# Patient Record
Sex: Female | Born: 1960 | Hispanic: No | Marital: Married
Health system: Northeastern US, Academic
[De-identification: ages and names within clinical notes are randomized; demographics above are authoritative.]

## PROBLEM LIST (undated history)

## (undated) DIAGNOSIS — E039 Hypothyroidism, unspecified: Secondary | ICD-10-CM

## (undated) DIAGNOSIS — Z8669 Personal history of other diseases of the nervous system and sense organs: Secondary | ICD-10-CM

## (undated) DIAGNOSIS — G893 Neoplasm related pain (acute) (chronic): Secondary | ICD-10-CM

## (undated) HISTORY — PX: BONE RESECTION, RIB: SHX900

## (undated) HISTORY — PX: ABDOMINAL HYSTERECTOMY: SHX81

---

## 1998-07-30 ENCOUNTER — Other Ambulatory Visit: Admission: RE | Admit: 1998-07-30 | Discharge: 1998-07-30 | Payer: Self-pay | Admitting: Gynecology

## 1998-10-30 ENCOUNTER — Ambulatory Visit (HOSPITAL_BASED_OUTPATIENT_CLINIC_OR_DEPARTMENT_OTHER): Admission: RE | Admit: 1998-10-30 | Discharge: 1998-10-30 | Payer: Self-pay | Admitting: Surgery

## 1999-03-24 ENCOUNTER — Encounter: Admission: RE | Admit: 1999-03-24 | Discharge: 1999-03-24 | Payer: Self-pay | Admitting: Chiropractic Medicine

## 1999-03-24 ENCOUNTER — Encounter: Payer: Self-pay | Admitting: Chiropractic Medicine

## 1999-04-06 ENCOUNTER — Encounter: Payer: Self-pay | Admitting: Orthopedic Surgery

## 1999-04-06 ENCOUNTER — Encounter: Admission: RE | Admit: 1999-04-06 | Discharge: 1999-04-06 | Payer: Self-pay | Admitting: Orthopedic Surgery

## 2000-01-18 ENCOUNTER — Other Ambulatory Visit: Admission: RE | Admit: 2000-01-18 | Discharge: 2000-01-18 | Payer: Self-pay | Admitting: Gynecology

## 2001-01-22 ENCOUNTER — Other Ambulatory Visit: Admission: RE | Admit: 2001-01-22 | Discharge: 2001-01-22 | Payer: Self-pay | Admitting: Gynecology

## 2002-05-28 ENCOUNTER — Encounter: Payer: Self-pay | Admitting: Endocrinology

## 2002-05-28 ENCOUNTER — Ambulatory Visit (HOSPITAL_COMMUNITY): Admission: RE | Admit: 2002-05-28 | Discharge: 2002-05-28 | Payer: Self-pay | Admitting: Endocrinology

## 2009-04-03 ENCOUNTER — Emergency Department (HOSPITAL_COMMUNITY): Admission: EM | Admit: 2009-04-03 | Discharge: 2009-04-03 | Payer: Self-pay | Admitting: Emergency Medicine

## 2009-05-29 ENCOUNTER — Ambulatory Visit (HOSPITAL_COMMUNITY): Admission: RE | Admit: 2009-05-29 | Discharge: 2009-05-29 | Payer: Self-pay | Admitting: Family Medicine

## 2009-06-01 ENCOUNTER — Ambulatory Visit (HOSPITAL_COMMUNITY): Admission: RE | Admit: 2009-06-01 | Discharge: 2009-06-01 | Payer: Self-pay | Admitting: Obstetrics and Gynecology

## 2009-10-13 ENCOUNTER — Emergency Department (HOSPITAL_COMMUNITY): Admission: EM | Admit: 2009-10-13 | Discharge: 2009-10-13 | Payer: Self-pay | Admitting: Emergency Medicine

## 2009-10-19 ENCOUNTER — Ambulatory Visit (HOSPITAL_COMMUNITY): Admission: RE | Admit: 2009-10-19 | Discharge: 2009-10-19 | Payer: Self-pay | Admitting: General Surgery

## 2010-05-25 ENCOUNTER — Other Ambulatory Visit (HOSPITAL_COMMUNITY): Payer: Self-pay | Admitting: Urology

## 2010-05-25 DIAGNOSIS — R109 Unspecified abdominal pain: Secondary | ICD-10-CM

## 2010-07-13 ENCOUNTER — Other Ambulatory Visit (HOSPITAL_COMMUNITY): Payer: Self-pay | Admitting: Obstetrics and Gynecology

## 2010-07-13 DIAGNOSIS — Z139 Encounter for screening, unspecified: Secondary | ICD-10-CM

## 2010-07-14 ENCOUNTER — Ambulatory Visit (HOSPITAL_COMMUNITY)
Admission: RE | Admit: 2010-07-14 | Discharge: 2010-07-14 | Disposition: A | Discharge status: Home / Self Care | Payer: 59 | Source: Ambulatory Visit | Attending: Urology | Admitting: Urology

## 2010-07-14 DIAGNOSIS — R109 Unspecified abdominal pain: Secondary | ICD-10-CM

## 2010-07-14 DIAGNOSIS — R1031 Right lower quadrant pain: Secondary | ICD-10-CM | POA: Insufficient documentation

## 2010-07-14 DIAGNOSIS — R9389 Abnormal findings on diagnostic imaging of other specified body structures: Secondary | ICD-10-CM | POA: Insufficient documentation

## 2010-07-14 MED ORDER — IOHEXOL 300 MG/ML  SOLN
125.0000 mL | Freq: Once | INTRAMUSCULAR | Status: AC | PRN
  Administered 2010-07-14: 125 mL via INTRAVENOUS

## 2010-07-19 ENCOUNTER — Ambulatory Visit (HOSPITAL_COMMUNITY)
Admission: RE | Admit: 2010-07-19 | Discharge: 2010-07-19 | Disposition: A | Discharge status: Home / Self Care | Payer: 59 | Source: Ambulatory Visit | Attending: Obstetrics and Gynecology | Admitting: Obstetrics and Gynecology

## 2010-07-19 DIAGNOSIS — Z1231 Encounter for screening mammogram for malignant neoplasm of breast: Secondary | ICD-10-CM | POA: Insufficient documentation

## 2010-07-19 DIAGNOSIS — Z139 Encounter for screening, unspecified: Secondary | ICD-10-CM

## 2010-08-30 DIAGNOSIS — R079 Chest pain, unspecified: Secondary | ICD-10-CM

## 2011-09-19 ENCOUNTER — Other Ambulatory Visit (HOSPITAL_COMMUNITY): Payer: Self-pay | Admitting: Obstetrics and Gynecology

## 2011-09-19 DIAGNOSIS — Z1231 Encounter for screening mammogram for malignant neoplasm of breast: Secondary | ICD-10-CM

## 2011-10-13 ENCOUNTER — Ambulatory Visit (HOSPITAL_COMMUNITY): Payer: 59

## 2011-10-13 ENCOUNTER — Other Ambulatory Visit (HOSPITAL_COMMUNITY): Payer: Self-pay | Admitting: Obstetrics and Gynecology

## 2011-10-13 DIAGNOSIS — Z1231 Encounter for screening mammogram for malignant neoplasm of breast: Secondary | ICD-10-CM

## 2011-10-14 ENCOUNTER — Ambulatory Visit (HOSPITAL_COMMUNITY)
Admission: RE | Admit: 2011-10-14 | Discharge: 2011-10-14 | Disposition: A | Discharge status: Home / Self Care | Payer: 59 | Source: Ambulatory Visit | Attending: Obstetrics and Gynecology | Admitting: Obstetrics and Gynecology

## 2011-10-14 DIAGNOSIS — Z1231 Encounter for screening mammogram for malignant neoplasm of breast: Secondary | ICD-10-CM

## 2011-12-27 HISTORY — PX: EAR CYST EXCISION: SHX22

## 2012-01-19 ENCOUNTER — Telehealth: Payer: Self-pay

## 2012-01-19 NOTE — Telephone Encounter (Signed)
LMOM to call.

## 2012-02-07 NOTE — Telephone Encounter (Signed)
Called and spoke to pt. She said she sees a neurosurgeon soon about some problems. She will wait until after that consult and give me a call. Letter sent to Dr. Sherryll Burger.

## 2012-03-22 DIAGNOSIS — D352 Benign neoplasm of pituitary gland: Secondary | ICD-10-CM | POA: Insufficient documentation

## 2012-06-26 ENCOUNTER — Encounter (HOSPITAL_COMMUNITY): Payer: Self-pay

## 2012-06-26 ENCOUNTER — Encounter (HOSPITAL_COMMUNITY): Payer: Self-pay | Admitting: Pharmacy Technician

## 2012-06-26 ENCOUNTER — Encounter (HOSPITAL_COMMUNITY)
Admission: RE | Admit: 2012-06-26 | Discharge: 2012-06-26 | Disposition: A | Discharge status: Home / Self Care | Payer: 59 | Source: Ambulatory Visit | Attending: General Surgery | Admitting: General Surgery

## 2012-06-26 HISTORY — DX: Hypothyroidism, unspecified: E03.9

## 2012-06-26 LAB — CBC WITH DIFFERENTIAL/PLATELET
Basophils Absolute: 0 10*3/uL (ref 0.0–0.1)
Basophils Relative: 0 % (ref 0–1)
Eosinophils Relative: 1 % (ref 0–5)
HCT: 32.9 % — ABNORMAL LOW (ref 36.0–46.0)
Lymphocytes Relative: 25 % (ref 12–46)
MCHC: 34.7 g/dL (ref 30.0–36.0)
MCV: 87.3 fL (ref 78.0–100.0)
Monocytes Absolute: 1 10*3/uL (ref 0.1–1.0)
Neutro Abs: 6 10*3/uL (ref 1.7–7.7)
Platelets: 322 10*3/uL (ref 150–400)
RDW: 13.1 % (ref 11.5–15.5)
WBC: 9.5 10*3/uL (ref 4.0–10.5)

## 2012-06-26 LAB — SURGICAL PCR SCREEN
MRSA, PCR: NEGATIVE
Staphylococcus aureus: NEGATIVE

## 2012-06-26 LAB — BASIC METABOLIC PANEL
BUN: 8 mg/dL (ref 6–23)
Calcium: 9.4 mg/dL (ref 8.4–10.5)
Creatinine, Ser: 0.62 mg/dL (ref 0.50–1.10)
GFR calc Af Amer: >90 mL/min (ref >90)

## 2012-06-26 NOTE — Patient Instructions (Addendum)
AJIA CHADDERDON  06/26/2012   Your procedure is scheduled on:  07/02/2012   Report to [facility] at  0730  AM.  Call this number if you have problems the morning of surgery: [phone]   Remember:   Do not eat food or drink liquids after midnight.   Take these medicines the morning of surgery with A SIP OF WATER: synthroid   Do not wear jewelry, make-up or nail polish.  Do not wear lotions, powders, or perfumes.   Do not shave 48 hours prior to surgery. Men may shave face and neck.  Do not bring valuables to the hospital.  Contacts, dentures or bridgework may not be worn into surgery.  Leave suitcase in the car. After surgery it may be brought to your room.  For patients admitted to the hospital, checkout time is 11:00 AM the day of discharge.   Patients discharged the day of surgery will not be allowed to drive  home.  Name and phone number of your driver: family  Special Instructions: Shower using CHG 2 nights before surgery and the night before surgery.  If you shower the day of surgery use CHG.  Use special wash - you have one bottle of CHG for all showers.  You should use approximately 1/3 of the bottle for each shower.   Please read over the following fact sheets that you were given: Pain Booklet, Coughing and Deep Breathing, MRSA Information, Surgical Site Infection Prevention, Anesthesia Post-op Instructions and Care and Recovery After Surgery Cyst Removal Your caregiver has removed a cyst. A cyst is a sac containing a semi-solid material. Cysts may occur any place on your body. They may remain small for years or gradually get larger. A sebaceous cyst is an enlarged (dilated) sweat gland filled with old sweat (sebum). Unattended, these may become large (the size of a softball) over several years time. These are often removed for improved appearance (cosmetic) reasons or before they become infected to form an abscess. An abscess is an infected cyst. HOME CARE INSTRUCTIONS    Keep your bandage clean and dry. You may change your bandage after 24 hours. If your bandage sticks, use warm water to gently loosen it. Pat the area dry with a clean towel before putting on another bandage.  If possible, keep the area where the cyst was removed raised to relieve soreness, swelling, and promote healing.  If you have stitches, keep them clean and dry.  You may clean your stitches gently with a cotton swab dipped in warm soapy water.  Do not soak the area where the cyst was removed or go swimming. You may shower.  Do not overuse the area where your cyst was removed.  Return in 7 days or as directed to have your stitches removed.  Take medicines as instructed by your caregiver. SEEK IMMEDIATE MEDICAL CARE IF:   An oral temperature above 102 F (38.9 C) develops, not controlled by medication.  Blood continues to soak through the bandage.  You have increasing pain in the area where your cyst was removed.  You have redness, swelling, pus, a bad smell, soreness (inflammation), or red streaks coming away from the stitches. These are signs of infection. MAKE SURE YOU:   Understand these instructions.  Will watch your condition.  Will get help right away if you are not doing well or get worse. Document Released: 03/11/2000 Document Revised: 06/06/2011 Document Reviewed: 07/05/2007 [facility] Patient Information 2013 [place], [place]. PATIENT INSTRUCTIONS  POST-ANESTHESIA  IMMEDIATELY FOLLOWING SURGERY:  Do not drive or operate machinery for the first twenty four hours after surgery.  Do not make any important decisions for twenty four hours after surgery or while taking narcotic pain medications or sedatives.  If you develop intractable nausea and vomiting or a severe headache please notify your doctor immediately.  FOLLOW-UP:  Please make an appointment with your surgeon as instructed. You do not need to follow up with anesthesia unless specifically instructed to do  so.  WOUND CARE INSTRUCTIONS (if applicable):  Keep a dry clean dressing on the anesthesia/puncture wound site if there is drainage.  Once the wound has quit draining you may leave it open to air.  Generally you should leave the bandage intact for twenty four hours unless there is drainage.  If the epidural site drains for more than 36-48 hours please call the anesthesia department.  QUESTIONS?:  Please feel free to call your physician or the hospital operator if you have any questions, and they will be happy to assist you.

## 2012-07-02 ENCOUNTER — Encounter (HOSPITAL_COMMUNITY): Payer: Self-pay | Admitting: *Deleted

## 2012-07-02 ENCOUNTER — Ambulatory Visit (HOSPITAL_COMMUNITY)
Admission: RE | Admit: 2012-07-02 | Discharge: 2012-07-02 | Disposition: A | Discharge status: Home / Self Care | Payer: 59 | Source: Ambulatory Visit | Attending: General Surgery | Admitting: General Surgery

## 2012-07-02 ENCOUNTER — Encounter (HOSPITAL_COMMUNITY)
Admission: RE | Disposition: A | Discharge status: Home / Self Care | Payer: Self-pay | Source: Ambulatory Visit | Attending: General Surgery

## 2012-07-02 ENCOUNTER — Encounter (HOSPITAL_COMMUNITY): Payer: Self-pay | Admitting: Anesthesiology

## 2012-07-02 ENCOUNTER — Ambulatory Visit (HOSPITAL_COMMUNITY): Payer: 59 | Admitting: Anesthesiology

## 2012-07-02 DIAGNOSIS — E039 Hypothyroidism, unspecified: Secondary | ICD-10-CM | POA: Insufficient documentation

## 2012-07-02 DIAGNOSIS — D1739 Benign lipomatous neoplasm of skin and subcutaneous tissue of other sites: Secondary | ICD-10-CM | POA: Insufficient documentation

## 2012-07-02 DIAGNOSIS — J45909 Unspecified asthma, uncomplicated: Secondary | ICD-10-CM | POA: Insufficient documentation

## 2012-07-02 DIAGNOSIS — Z79899 Other long term (current) drug therapy: Secondary | ICD-10-CM | POA: Insufficient documentation

## 2012-07-02 DIAGNOSIS — L723 Sebaceous cyst: Secondary | ICD-10-CM

## 2012-07-02 HISTORY — PX: EAR CYST EXCISION: SHX22

## 2012-07-02 SURGERY — CYST REMOVAL
Anesthesia: General | Site: Scalp | Laterality: Right | Wound class: Clean

## 2012-07-02 MED ORDER — ONDANSETRON HCL 4 MG/2ML IJ SOLN
4.0000 mg | Freq: Once | INTRAMUSCULAR | Status: AC | PRN
  Administered 2012-07-02: 4 mg via INTRAVENOUS

## 2012-07-02 MED ORDER — CEFAZOLIN SODIUM-DEXTROSE 2-3 GM-% IV SOLR
INTRAVENOUS | Status: DC | PRN
  Administered 2012-07-02: 2 g via INTRAVENOUS

## 2012-07-02 MED ORDER — MIDAZOLAM HCL 2 MG/2ML IJ SOLN
1.0000 mg | INTRAMUSCULAR | Status: DC | PRN
  Administered 2012-07-02: 2 mg via INTRAVENOUS

## 2012-07-02 MED ORDER — LACTATED RINGERS IV SOLN
INTRAVENOUS | Status: DC | PRN
  Administered 2012-07-02: 08:00:00 via INTRAVENOUS

## 2012-07-02 MED ORDER — FENTANYL CITRATE 0.05 MG/ML IJ SOLN
INTRAMUSCULAR | Status: AC
  Filled 2012-07-02: qty 2

## 2012-07-02 MED ORDER — BUPIVACAINE HCL (PF) 0.5 % IJ SOLN
INTRAMUSCULAR | Status: AC
  Filled 2012-07-02: qty 30

## 2012-07-02 MED ORDER — ONDANSETRON HCL 4 MG/2ML IJ SOLN
INTRAMUSCULAR | Status: AC
  Filled 2012-07-02: qty 2

## 2012-07-02 MED ORDER — CEFAZOLIN SODIUM-DEXTROSE 2-3 GM-% IV SOLR: 2.0000 g | INTRAVENOUS | Status: DC

## 2012-07-02 MED ORDER — HYDROCODONE-ACETAMINOPHEN 5-325 MG PO TABS: 1.0000 | ORAL_TABLET | ORAL | Status: DC | PRN

## 2012-07-02 MED ORDER — LACTATED RINGERS IV SOLN
INTRAVENOUS | Status: DC
  Administered 2012-07-02: 09:00:00 via INTRAVENOUS

## 2012-07-02 MED ORDER — CEFAZOLIN SODIUM-DEXTROSE 2-3 GM-% IV SOLR
INTRAVENOUS | Status: AC
  Filled 2012-07-02: qty 50

## 2012-07-02 MED ORDER — BACITRACIN ZINC 500 UNIT/GM EX OINT
TOPICAL_OINTMENT | CUTANEOUS | Status: DC | PRN
  Administered 2012-07-02: 1 via TOPICAL

## 2012-07-02 MED ORDER — SODIUM CHLORIDE 0.9 % IR SOLN
Status: DC | PRN
  Administered 2012-07-02: 1000 mL

## 2012-07-02 MED ORDER — PROPOFOL 10 MG/ML IV EMUL
INTRAVENOUS | Status: DC | PRN
  Administered 2012-07-02: 150 mg via INTRAVENOUS

## 2012-07-02 MED ORDER — CELECOXIB 100 MG PO CAPS
400.0000 mg | ORAL_CAPSULE | Freq: Once | ORAL | Status: AC
  Administered 2012-07-02: 400 mg via ORAL

## 2012-07-02 MED ORDER — LIDOCAINE-EPINEPHRINE (PF) 1 %-1:200000 IJ SOLN
INTRAMUSCULAR | Status: AC
  Filled 2012-07-02: qty 10

## 2012-07-02 MED ORDER — LIDOCAINE HCL (CARDIAC) 10 MG/ML IV SOLN
INTRAVENOUS | Status: DC | PRN
  Administered 2012-07-02: 50 mg via INTRAVENOUS

## 2012-07-02 MED ORDER — LIDOCAINE-EPINEPHRINE (PF) 1 %-1:200000 IJ SOLN
INTRAMUSCULAR | Status: DC | PRN
  Administered 2012-07-02: 3 mL

## 2012-07-02 MED ORDER — MIDAZOLAM HCL 2 MG/2ML IJ SOLN
INTRAMUSCULAR | Status: AC
  Filled 2012-07-02: qty 2

## 2012-07-02 MED ORDER — PROPOFOL 10 MG/ML IV EMUL
INTRAVENOUS | Status: AC
  Filled 2012-07-02: qty 20

## 2012-07-02 MED ORDER — FENTANYL CITRATE 0.05 MG/ML IJ SOLN
25.0000 ug | INTRAMUSCULAR | Status: DC | PRN
  Administered 2012-07-02: 25 ug via INTRAVENOUS

## 2012-07-02 MED ORDER — FENTANYL CITRATE 0.05 MG/ML IJ SOLN
25.0000 ug | INTRAMUSCULAR | Status: DC | PRN
  Administered 2012-07-02 (×2): 50 ug via INTRAVENOUS

## 2012-07-02 MED ORDER — CELECOXIB 100 MG PO CAPS
ORAL_CAPSULE | ORAL | Status: AC
  Filled 2012-07-02: qty 4

## 2012-07-02 MED ORDER — ARTIFICIAL TEARS OP OINT
TOPICAL_OINTMENT | OPHTHALMIC | Status: AC
  Filled 2012-07-02: qty 3.5

## 2012-07-02 MED ORDER — BACITRACIN ZINC 500 UNIT/GM EX OINT
TOPICAL_OINTMENT | CUTANEOUS | Status: AC
  Filled 2012-07-02: qty 1.8

## 2012-07-02 MED ORDER — ONDANSETRON HCL 4 MG/2ML IJ SOLN
4.0000 mg | Freq: Once | INTRAMUSCULAR | Status: AC
  Administered 2012-07-02: 4 mg via INTRAVENOUS

## 2012-07-02 MED ORDER — FENTANYL CITRATE 0.05 MG/ML IJ SOLN
INTRAMUSCULAR | Status: DC | PRN
  Administered 2012-07-02 (×4): 25 ug via INTRAVENOUS

## 2012-07-02 MED ORDER — LIDOCAINE HCL (PF) 1 % IJ SOLN
INTRAMUSCULAR | Status: AC
  Filled 2012-07-02: qty 5

## 2012-07-02 SURGICAL SUPPLY — 33 items
APL SKNCLS STERI-STRIP NONHPOA (GAUZE/BANDAGES/DRESSINGS) ×1
BAG HAMPER (MISCELLANEOUS) ×2 IMPLANT
BENZOIN TINCTURE PRP APPL 2/3 (GAUZE/BANDAGES/DRESSINGS) ×2 IMPLANT
CLOTH BEACON ORANGE TIMEOUT ST (SAFETY) ×2 IMPLANT
COVER LIGHT HANDLE STERIS (MISCELLANEOUS) ×4 IMPLANT
DURAPREP 26ML APPLICATOR (WOUND CARE) IMPLANT
ELECT NEEDLE TIP 2.8 STRL (NEEDLE) ×2 IMPLANT
ELECT REM PT RETURN 9FT ADLT (ELECTROSURGICAL) ×2
ELECTRODE REM PT RTRN 9FT ADLT (ELECTROSURGICAL) ×1 IMPLANT
FORMALIN 10 PREFIL 120ML (MISCELLANEOUS) ×2 IMPLANT
GLOVE BIOGEL PI IND STRL 7.0 (GLOVE) ×1 IMPLANT
GLOVE BIOGEL PI IND STRL 7.5 (GLOVE) ×1 IMPLANT
GLOVE BIOGEL PI INDICATOR 7.0 (GLOVE) ×1
GLOVE BIOGEL PI INDICATOR 7.5 (GLOVE) ×1
GLOVE ECLIPSE 7.0 STRL STRAW (GLOVE) ×4 IMPLANT
GLOVE OPTIFIT SS 6.5 STRL BRWN (GLOVE) ×2 IMPLANT
GOWN STRL REIN XL XLG (GOWN DISPOSABLE) ×4 IMPLANT
KIT ROOM TURNOVER APOR (KITS) ×2 IMPLANT
MANIFOLD NEPTUNE II (INSTRUMENTS) ×2 IMPLANT
NEEDLE HYPO 18GX1.5 BLUNT FILL (NEEDLE) ×2 IMPLANT
NEEDLE HYPO 25X1 1.5 SAFETY (NEEDLE) ×2 IMPLANT
NS IRRIG 1000ML POUR BTL (IV SOLUTION) ×2 IMPLANT
PACK MINOR (CUSTOM PROCEDURE TRAY) ×2 IMPLANT
PAD ARMBOARD 7.5X6 YLW CONV (MISCELLANEOUS) ×2 IMPLANT
SET BASIN LINEN APH (SET/KITS/TRAYS/PACK) ×2 IMPLANT
SOL PREP PROV IODINE SCRUB 4OZ (MISCELLANEOUS) IMPLANT
STRIP CLOSURE SKIN 1/2X4 (GAUZE/BANDAGES/DRESSINGS) ×2 IMPLANT
SUT MNCRL AB 4-0 PS2 18 (SUTURE) IMPLANT
SUT PROLENE 3 0 PS 1 (SUTURE) ×2 IMPLANT
SUT VIC AB 3-0 SH 27 (SUTURE)
SUT VIC AB 3-0 SH 27X BRD (SUTURE) IMPLANT
SYR BULB IRRIGATION 50ML (SYRINGE) ×2 IMPLANT
SYR CONTROL 10ML LL (SYRINGE) ×2 IMPLANT

## 2012-07-02 NOTE — Anesthesia Preprocedure Evaluation (Signed)
Anesthesia Evaluation  Patient identified by MRN, date of birth, ID band Patient awake    Reviewed: Allergy & Precautions, H&P , NPO status , Patient's Chart, lab work & pertinent test results  History of Anesthesia Complications Negative for: history of anesthetic complications  Airway Mallampati: II TM Distance: >3 FB Neck ROM: Full    Dental  (+) Teeth Intact   Pulmonary neg pulmonary ROS,  breath sounds clear to auscultation        Cardiovascular negative cardio ROS  Rhythm:Regular Rate:Normal     Neuro/Psych    GI/Hepatic negative GI ROS,   Endo/Other  Hypothyroidism   Renal/GU      Musculoskeletal   Abdominal   Peds  Hematology   Anesthesia Other Findings   Reproductive/Obstetrics                           Anesthesia Physical Anesthesia Plan  ASA: II  Anesthesia Plan: General   Post-op Pain Management:    Induction: Intravenous  Airway Management Planned: LMA  Additional Equipment:   Intra-op Plan:   Post-operative Plan: Extubation in OR  Informed Consent: I have reviewed the patients History and Physical, chart, labs and discussed the procedure including the risks, benefits and alternatives for the proposed anesthesia with the patient or authorized representative who has indicated his/her understanding and acceptance.     Plan Discussed with:   Anesthesia Plan Comments: (Left Lateral position)        Anesthesia Quick Evaluation

## 2012-07-02 NOTE — Anesthesia Procedure Notes (Signed)
Procedure Name: LMA Insertion Date/Time: 07/02/2012 9:26 AM Performed by: Carolyne Littles, Laurene Melendrez L Pre-anesthesia Checklist: Patient identified, Patient being monitored, Emergency Drugs available, Timeout performed and Suction available Patient Re-evaluated:Patient Re-evaluated prior to inductionOxygen Delivery Method: Circle system utilized Intubation Type: IV induction Ventilation: Mask ventilation without difficulty LMA: LMA inserted LMA Size: 4.0 Number of attempts: 1 Placement Confirmation: positive ETCO2 and breath sounds checked- equal and bilateral Tube secured with: Tape Dental Injury: Teeth and Oropharynx as per pre-operative assessment

## 2012-07-02 NOTE — Transfer of Care (Signed)
Immediate Anesthesia Transfer of Care Note  Patient: Donna Strong  Procedure(s) Performed: Procedure(s): EXCISION OF SEBACEOUS CYST OF SCALP (Right)  Patient Location: PACU  Anesthesia Type:General  Level of Consciousness: awake, alert , oriented and patient cooperative  Airway & Oxygen Therapy: Patient Spontanous Breathing and Patient connected to face mask oxygen  Post-op Assessment: Report given to PACU RN and Post -op Vital signs reviewed and stable  Post vital signs: Reviewed and stable  Complications: No apparent anesthesia complications

## 2012-07-02 NOTE — Interval H&P Note (Signed)
History and Physical Interval Note:  07/02/2012 9:01 AM  Donna Strong  has presented today for surgery, with the diagnosis of sebaceous cyst right scalp  The various methods of treatment have been discussed with the patient and family. After consideration of risks, benefits and other options for treatment, the patient has consented to  Procedure(s): EXCISION OF SEBACEOUS CYST OF SCALP (Right) as a surgical intervention .  The patient's history has been reviewed, patient examined, no change in status, stable for surgery.  I have reviewed the patient's chart and labs.  Questions were answered to the patient's satisfaction.     [facility]

## 2012-07-02 NOTE — H&P (Signed)
  NTS SOAP Note  Vital Signs:  Vitals as of: 06/26/2012: Systolic 135: Diastolic 89: Heart Rate 96: Temp 97.74F: Height 3ft 10.5in: Weight 186Lbs 0 Ounces: Pain Level 5: BMI 26.31  BMI : 26.31 kg/m2  Subjective: This 38 Years 32 Months old Female presents for of tender area on posterior scalp.  Similar lesion/nodule in the past.  Excised last year at [facility].  Has since recurred.  NO interest in returning to primary surgeon.  No discharge.  No fever or chills.  No evidence of infection.  Slow increase in size.  Review of Symptoms:  Constitutional:unremarkable       as per HPI :   Nose/Mouth/Throat:unremarkable Cardiovascular:  unremarkable   Respiratory:unremarkable   Gastrointestinal:  unremarkable   Genitourinary:unremarkable     Musculoskeletal:unremarkable   Skin:unremarkable Breast:unremarkable   Hematolgic/Lymphatic:unremarkable     Allergic/Immunologic:unremarkable     Past Medical History:  Obtained     Past Medical History  Pregnancy Gravida: 1 Pregnancy Para: 1 Surgical History: Cyst excision (scalp [facility]) Medical Problems: Hypothyroidism, asthma Psychiatric History: none Allergies: NKDA Medications: synthroid.   Social History:Obtained  Social History  Preferred Language: English Race:  Black or African American Ethnicity: Not Hispanic / Latino Age: 8 Years 9 Months Marital Status:  S Alcohol: none Recreational drug(s): none   Smoking Status: Never smoker reviewed on 06/26/2012 Functional Status reviewed on mm/dd/yyyy ------------------------------------------------ Bathing: Normal Cooking: Normal Dressing: Normal Driving: Normal Eating: Normal Managing Meds: Normal Oral Care: Normal Shopping: Normal Toileting: Normal Transferring: Normal Walking: Normal Cognitive Status reviewed on mm/dd/yyyy ------------------------------------------------ Attention: Normal Decision Making: Normal Language:  Normal Memory: Normal Motor: Normal Perception: Normal Problem Solving: Normal Visual and Spatial: Normal   Family History:Obtained     Family History  Is there a family history of: noncontributory    Objective Information: General:  Well appearing, well nourished in no distress. Skin:     no rash or prominent lesions Head:Atraumatic; no masses; no abnormalities.  There is a small ~2cm firm somewhat mobile nodue on right posterior scalp.  Minimal tenderness.  No drainage.  Scar adjacent. Eyes:  conjunctiva clear, EOM intact, PERRL Mouth:  Mucous membranes moist, no mucosal lesions. Throat:  no erythema, exudates or lesions. Neck:  Supple without lymphadenopathy.  Heart:  RRR, no murmur Lungs:    CTA bilaterally, no wheezes, rhonchi, rales.  Breathing unlabored. Abdomen:Soft, NT/ND, no HSM, no masses.  Assessment:    Plan: Recurrent sebacceous cyst scalp.  Options discussed with patient.  She will schedule at her convenience.    Patient Education:Alternative treatments to surgery were discussed with patient (and family).  Risks and benefits  of procedure were fully explained to the patient (and family) who gave informed consent. Patient/family questions were addressed.  Follow-up:Pending Surgery

## 2012-07-02 NOTE — Anesthesia Postprocedure Evaluation (Signed)
  Anesthesia Post-op Note  Patient: Donna Strong  Procedure(s) Performed: Procedure(s): EXCISION OF SEBACEOUS CYST OF SCALP (Right)  Patient Location: PACU  Anesthesia Type:General  Level of Consciousness: awake, alert , oriented and patient cooperative  Airway and Oxygen Therapy: Patient Spontanous Breathing and Patient connected to face mask oxygen  Post-op Pain: mild  Post-op Assessment: Post-op Vital signs reviewed, Patient's Cardiovascular Status Stable, Respiratory Function Stable, Patent Airway and No signs of Nausea or vomiting  Post-op Vital Signs: Reviewed and stable  Complications: No apparent anesthesia complications

## 2012-07-02 NOTE — Op Note (Signed)
Patient:  Donna Strong  DOB:  04-15-1960  MRN:  [id number]   Preop Diagnosis:  Soft tissue mass/sebaceous cyst of the right posterior scalp  Postop Diagnosis:  The same  Procedure:  Excision of cyst via a 2.5 cm incision  Surgeon:  Dr. Tilford Pillar  Anes:  General via laryngeal mask airway, 1% lidocaine with epinephrine  Indications:  Patient is a 52 year old female presented my office with a history of a recurrent soft tissue mass in the right posterior scalp. Workup and evaluation was consistent for sebaceous cyst. Risks benefits alternatives of excision were discussed. Risk including but not limited to risk of bleeding, infection, recurrence, skin dehiscence were discussed. Patient's questions and concerns are addressed. Patient was consented for planned procedure.  Procedure note:  Patient is taken to the operating room was placed in supine position on the or table time the general anesthetic is a Optician, dispensing. Once patient was asleep a larger mask airway was placed. At this point the patient's head is rotated left and lateral to expose the area. The site is prepped with Betadine solution. Drapes are placed in standard fashion. Time out was performed. Local anesthetic is a still on the planned course of the incision. An elliptical incision was created over the soft tissue mass with a 15 blade scalpel with additional dissection down to subcuticular tissue carried out using needle-tip letter cautery. This dissection is carried out circumferentially around the palpable cyst and previous scar tissue. This dissection is carried down to the periosteum. The surrounding tissue was healthy and viable. Hemostasis is obtained during the dissection using electrocautery. At this point the wound is irrigated. The skin was reapproximated using 3-0 Prolene in simple or fashion. The skin was washed dried moist dry towel. Bacitracin ointment was placed over the incision. The drapes removed. The patient was  allowed to come out of general anesthetic and stretcher back in stable condition. At the conclusion of procedure all instrument, sponge, needle counts are correct. Patient tolerated procedure extremely well.  Complications:  None apparent  EBL:  Minimal  Specimen:  Soft tissue mass of scalp

## 2012-07-02 NOTE — Preoperative (Signed)
Beta Blockers   Reason not to administer Beta Blockers:Not Applicable 

## 2012-07-03 LAB — GLUCOSE, CAPILLARY: Glucose-Capillary: 152 mg/dL — ABNORMAL HIGH (ref 70–99)

## 2012-07-04 ENCOUNTER — Encounter (HOSPITAL_COMMUNITY): Payer: Self-pay | Admitting: General Surgery

## 2012-07-20 DIAGNOSIS — M792 Neuralgia and neuritis, unspecified: Secondary | ICD-10-CM | POA: Insufficient documentation

## 2012-12-19 ENCOUNTER — Other Ambulatory Visit (HOSPITAL_COMMUNITY): Payer: Self-pay | Admitting: Internal Medicine

## 2012-12-19 ENCOUNTER — Telehealth: Payer: Self-pay | Admitting: *Deleted

## 2012-12-19 DIAGNOSIS — Z139 Encounter for screening, unspecified: Secondary | ICD-10-CM

## 2012-12-19 NOTE — Telephone Encounter (Signed)
Pt was referred by Dr. Sherryll Burger in 12/2011 for colonoscopy and pt wanted to wait for awhile due to other problems. I called and LMOM for a return call.

## 2012-12-19 NOTE — Telephone Encounter (Signed)
Pt called stating to schedule a TCS. Please advise [phone]

## 2012-12-20 ENCOUNTER — Telehealth: Payer: Self-pay

## 2012-12-20 NOTE — Telephone Encounter (Signed)
Opened in error

## 2012-12-20 NOTE — Telephone Encounter (Signed)
Pt will call back later. She would like appt in Nov. She is on my Oct recall to get scheduled for 1st or 2nd week of Nov. 2014.

## 2012-12-31 ENCOUNTER — Ambulatory Visit (HOSPITAL_COMMUNITY)
Admission: RE | Admit: 2012-12-31 | Discharge: 2012-12-31 | Disposition: A | Discharge status: Home / Self Care | Payer: 59 | Source: Ambulatory Visit | Attending: Internal Medicine | Admitting: Internal Medicine

## 2012-12-31 DIAGNOSIS — Z139 Encounter for screening, unspecified: Secondary | ICD-10-CM

## 2012-12-31 DIAGNOSIS — Z1231 Encounter for screening mammogram for malignant neoplasm of breast: Secondary | ICD-10-CM | POA: Insufficient documentation

## 2013-01-07 ENCOUNTER — Telehealth: Payer: Self-pay

## 2013-01-07 NOTE — Telephone Encounter (Signed)
PT left VM for me to call. I called and left message with someone for her to return call.

## 2013-01-09 ENCOUNTER — Telehealth: Payer: Self-pay

## 2013-01-09 ENCOUNTER — Other Ambulatory Visit: Payer: Self-pay

## 2013-01-09 DIAGNOSIS — Z1211 Encounter for screening for malignant neoplasm of colon: Secondary | ICD-10-CM

## 2013-01-10 NOTE — Telephone Encounter (Signed)
Gastroenterology Pre-Procedure Review  Request Date: 01/09/2013 Requesting Physician: Dr. Sherryll Burger  PATIENT REVIEW QUESTIONS: The patient responded to the following health history questions as indicated:    1. Diabetes Melitis: no 2. Joint replacements in the past 12 months: no 3. Major health problems in the past 3 months: no 4. Has an artificial valve or MVP: no 5. Has a defibrillator: no 6. Has been advised in past to take antibiotics in advance of a procedure like teeth cleaning: no    MEDICATIONS & ALLERGIES:    Patient reports the following regarding taking any blood thinners:   Plavix? no Aspirin? no Coumadin? no  Patient confirms/reports the following medications:  Current Outpatient Prescriptions  Medication Sig Dispense Refill  . levothyroxine (SYNTHROID, LEVOTHROID) 50 MCG tablet Take 50 mcg by mouth daily before breakfast.      . HYDROcodone-acetaminophen (NORCO) 5-325 MG per tablet Take 1-2 tablets by mouth every 4 (four) hours as needed for pain.  35 tablet  0   No current facility-administered medications for this visit.    Patient confirms/reports the following allergies:  Allergies  Allergen Reactions  . Codeine Nausea And Vomiting    Must have promethazine if she takes.    No orders of the defined types were placed in this encounter.    AUTHORIZATION INFORMATION Primary Insurance:   ID #:   Group #:  Pre-Cert / Auth required:  Pre-Cert / Auth #:   Secondary Insurance:   ID #:   Group #:  Pre-Cert / Auth required: Pre-Cert / Auth #:   SCHEDULE INFORMATION: Procedure has been scheduled as follows:  Date:01/28/2013                  Time: 9:30 AM  Location: [facility] Short Stay  This Gastroenterology Pre-Precedure Review Form is being routed to the following provider(s): Jonette Eva, MD

## 2013-01-10 NOTE — Telephone Encounter (Signed)
PREPOPIK-DRINK WATER TO KEEP URINE LIGHT YELLOW.  PT SHOULD DROP OFF RX 3 DAYS PRIOR TO PROCEDURE.  

## 2013-01-15 MED ORDER — SOD PICOSULFATE-MAG OX-CIT ACD 10-3.5-12 MG-GM-GM PO PACK: 1.0000 | PACK | ORAL | Status: DC

## 2013-01-15 NOTE — Telephone Encounter (Signed)
Rx sent to the pharmacy and instructions mailed to pt.  

## 2013-01-17 ENCOUNTER — Encounter (HOSPITAL_COMMUNITY): Payer: Self-pay | Admitting: Pharmacy Technician

## 2013-01-24 ENCOUNTER — Telehealth: Payer: Self-pay

## 2013-01-24 NOTE — Telephone Encounter (Signed)
I called [employer] and spoke to Cheryl B at [phone] and PA is not required for screening colonoscopy.  

## 2013-01-28 ENCOUNTER — Encounter (HOSPITAL_COMMUNITY): Payer: Self-pay | Admitting: *Deleted

## 2013-01-28 ENCOUNTER — Encounter (HOSPITAL_COMMUNITY)
Admission: RE | Disposition: A | Discharge status: Home / Self Care | Payer: Self-pay | Source: Ambulatory Visit | Attending: Gastroenterology

## 2013-01-28 ENCOUNTER — Ambulatory Visit (HOSPITAL_COMMUNITY)
Admission: RE | Admit: 2013-01-28 | Discharge: 2013-01-28 | Disposition: A | Discharge status: Home / Self Care | Payer: 59 | Source: Ambulatory Visit | Attending: Gastroenterology | Admitting: Gastroenterology

## 2013-01-28 DIAGNOSIS — K648 Other hemorrhoids: Secondary | ICD-10-CM | POA: Insufficient documentation

## 2013-01-28 DIAGNOSIS — K5730 Diverticulosis of large intestine without perforation or abscess without bleeding: Secondary | ICD-10-CM

## 2013-01-28 DIAGNOSIS — Z1211 Encounter for screening for malignant neoplasm of colon: Secondary | ICD-10-CM | POA: Insufficient documentation

## 2013-01-28 DIAGNOSIS — K620 Anal polyp: Secondary | ICD-10-CM

## 2013-01-28 DIAGNOSIS — D128 Benign neoplasm of rectum: Secondary | ICD-10-CM | POA: Insufficient documentation

## 2013-01-28 DIAGNOSIS — K621 Rectal polyp: Secondary | ICD-10-CM

## 2013-01-28 DIAGNOSIS — D126 Benign neoplasm of colon, unspecified: Secondary | ICD-10-CM

## 2013-01-28 HISTORY — DX: Personal history of other diseases of the nervous system and sense organs: Z86.69

## 2013-01-28 HISTORY — PX: COLONOSCOPY: SHX5424

## 2013-01-28 SURGERY — COLONOSCOPY
Anesthesia: Moderate Sedation

## 2013-01-28 MED ORDER — MIDAZOLAM HCL 5 MG/5ML IJ SOLN
INTRAMUSCULAR | Status: AC
  Filled 2013-01-28: qty 10

## 2013-01-28 MED ORDER — SODIUM CHLORIDE 0.9 % IV SOLN
INTRAVENOUS | Status: DC
  Administered 2013-01-28: 08:00:00 via INTRAVENOUS

## 2013-01-28 MED ORDER — MEPERIDINE HCL 100 MG/ML IJ SOLN
INTRAMUSCULAR | Status: AC
  Filled 2013-01-28: qty 2

## 2013-01-28 MED ORDER — MEPERIDINE HCL 100 MG/ML IJ SOLN
INTRAMUSCULAR | Status: DC | PRN
  Administered 2013-01-28 (×2): 25 mg via INTRAVENOUS

## 2013-01-28 MED ORDER — MIDAZOLAM HCL 5 MG/5ML IJ SOLN
INTRAMUSCULAR | Status: DC | PRN
  Administered 2013-01-28 (×2): 2 mg via INTRAVENOUS

## 2013-01-28 NOTE — Op Note (Signed)
[facility] [address] [place], [address]   COLONOSCOPY PROCEDURE REPORT  PATIENT: Donna Strong, Donna Strong  MR#: [id number] BIRTHDATE: 1961/03/08 , 52  yrs. old GENDER: Female ENDOSCOPIST: Jonette Eva, MD REFERRED XB:JYNWGN Sherryll Burger, M.D. PROCEDURE DATE:  01/28/2013 PROCEDURE:   Colonoscopy with cold biopsy polypectomy INDICATIONS:Average risk patient for colon cancer. MEDICATIONS: Demerol 50 mg IV and Versed 4 mg IV  DESCRIPTION OF PROCEDURE:    Physical exam was performed.  Informed consent was obtained from the patient after explaining the benefits, risks, and alternatives to procedure.  The patient was connected to monitor and placed in left lateral position. Continuous oxygen was provided by nasal cannula and IV medicine administered through an indwelling cannula.  After administration of sedation and rectal exam, the patients rectum was intubated and the EC-[id number] ([id number])  colonoscope was advanced under direct visualization to the ileum.  The scope was removed slowly by carefully examining the color, texture, anatomy, and integrity mucosa on the way out.  The patient was recovered in endoscopy and discharged home in satisfactory condition.    COLON FINDINGS: Moderate diverticulosis was noted throughout the entire examined colon.  ,TWO sessile polyp measuring 3 mm in size found in the transverse colon & rectum.  A polypectomy was performed with cold forceps.  , and Moderate sized internal hemorrhoids were found.  PREP QUALITY: excellent.  CECAL W/D TIME: 14 minutes COMPLICATIONS: None  ENDOSCOPIC IMPRESSION: 1.   Moderate diverticulosis throughout the entire examined colon 2.   TWO COLORECTAL POLYPS REMOVED 3.   Moderate sized internal hemorrhoids  RECOMMENDATIONS: FOLLOW A HIGH FIBER DIET.  AVOID ITEMS THAT CAUSE BLOATING. BIOPSY RESULTS SHOULD BE BACK IN 7 DAYS. Next colonoscopy in 10 years.       _______________________________ Rosalie DoctorJonette Eva, MD 01/28/2013 1:32 PM

## 2013-01-28 NOTE — H&P (Signed)
  Primary Care Physician:  Kirstie Peri, MD Primary Gastroenterologist:  Dr. Darrick Penna  Pre-Procedure History & Physical: HPI:  Donna Strong is a 52 y.o. female here for COLON CANCER SCREENING.  Past Medical History  Diagnosis Date  . Hypothyroidism   . H/O thoracic outlet syndrome     Past Surgical History  Procedure Laterality Date  . Abdominal hysterectomy    . Bone resection, rib    . Ear cyst excision Right 07/02/2012    Procedure: EXCISION OF SEBACEOUS CYST OF SCALP;  Surgeon: Fabio Bering, MD;  Location: [facility];  Service: General;  Laterality: Right;  . Ear cyst excision Left 12/2011    Prior to Admission medications   Medication Sig Start Date End Date Taking? Authorizing Provider  levothyroxine (SYNTHROID, LEVOTHROID) 50 MCG tablet Take 50 mcg by mouth daily before breakfast.   Yes Historical Provider, MD    Allergies as of 01/09/2013 - Review Complete 01/09/2013  Allergen Reaction Noted  . Codeine Nausea And Vomiting 06/26/2012    Family History  Problem Relation Age of Onset  . Colon cancer Other   . Colon cancer Maternal Uncle     History   Social History  . Marital Status: Married    Spouse Name: N/A    Number of Children: N/A  . Years of Education: N/A   Occupational History  . Not on file.   Social History Main Topics  . Smoking status: Never Smoker   . Smokeless tobacco: Not on file  . Alcohol Use: No  . Drug Use: No  . Sexual Activity: Yes    Birth Control/ Protection: Surgical   Other Topics Concern  . Not on file   Social History Narrative  . No narrative on file    Review of Systems: See HPI, otherwise negative ROS   Physical Exam: BP 157/96  Pulse 72  Temp(Src) 97.9 F (36.6 C) (Oral)  Resp 20  Ht 5\' 11"  (1.803 m)  Wt 190 lb (86.183 kg)  BMI 26.51 kg/m2  SpO2 99% General:   Alert,  pleasant and cooperative in NAD Head:  Normocephalic and atraumatic. Neck:  Supple; Lungs:  Clear throughout to auscultation.    Heart:   Regular rate and rhythm. Abdomen:  Soft, nontender and nondistended. Normal bowel sounds, without guarding, and without rebound.   Neurologic:  Alert and  oriented x4;  grossly normal neurologically.  Impression/Plan:     SCREENING  Plan:  1. TCS TODAY

## 2013-01-31 ENCOUNTER — Encounter (HOSPITAL_COMMUNITY): Payer: Self-pay | Admitting: Gastroenterology

## 2013-02-07 ENCOUNTER — Telehealth: Payer: Self-pay | Admitting: Gastroenterology

## 2013-02-07 NOTE — Telephone Encounter (Signed)
Please call pt. She had a simple adenoma removed from her colon. FOLLOW A High fiber diet. TCS in 10 years.

## 2013-02-07 NOTE — Telephone Encounter (Signed)
Pt aware of results 

## 2013-02-07 NOTE — Telephone Encounter (Signed)
LMOM to call back

## 2013-05-09 ENCOUNTER — Other Ambulatory Visit: Payer: Self-pay | Admitting: Endocrinology

## 2013-05-09 DIAGNOSIS — D497 Neoplasm of unspecified behavior of endocrine glands and other parts of nervous system: Secondary | ICD-10-CM

## 2013-05-09 DIAGNOSIS — E041 Nontoxic single thyroid nodule: Secondary | ICD-10-CM

## 2013-05-27 ENCOUNTER — Ambulatory Visit
Admission: RE | Admit: 2013-05-27 | Discharge: 2013-05-27 | Disposition: A | Discharge status: Home / Self Care | Payer: 59 | Source: Ambulatory Visit | Attending: Endocrinology | Admitting: Endocrinology

## 2013-05-27 DIAGNOSIS — D497 Neoplasm of unspecified behavior of endocrine glands and other parts of nervous system: Secondary | ICD-10-CM

## 2013-05-27 DIAGNOSIS — E041 Nontoxic single thyroid nodule: Secondary | ICD-10-CM

## 2013-05-27 MED ORDER — GADOBENATE DIMEGLUMINE 529 MG/ML IV SOLN
11.0000 mL | Freq: Once | INTRAVENOUS | Status: AC | PRN
  Administered 2013-05-27: 11 mL via INTRAVENOUS

## 2013-06-03 ENCOUNTER — Other Ambulatory Visit: Payer: Self-pay | Admitting: Endocrinology

## 2013-06-03 DIAGNOSIS — E042 Nontoxic multinodular goiter: Secondary | ICD-10-CM

## 2013-06-11 ENCOUNTER — Ambulatory Visit
Admission: RE | Admit: 2013-06-11 | Discharge: 2013-06-11 | Disposition: A | Discharge status: Home / Self Care | Payer: 59 | Source: Ambulatory Visit | Attending: Endocrinology | Admitting: Endocrinology

## 2013-06-11 ENCOUNTER — Other Ambulatory Visit: Payer: 59

## 2013-06-11 ENCOUNTER — Other Ambulatory Visit (HOSPITAL_COMMUNITY)
Admission: RE | Admit: 2013-06-11 | Discharge: 2013-06-11 | Disposition: A | Discharge status: Home / Self Care | Payer: 59 | Source: Ambulatory Visit | Attending: Interventional Radiology | Admitting: Interventional Radiology

## 2013-06-11 DIAGNOSIS — E041 Nontoxic single thyroid nodule: Secondary | ICD-10-CM | POA: Insufficient documentation

## 2013-06-11 DIAGNOSIS — E042 Nontoxic multinodular goiter: Secondary | ICD-10-CM

## 2013-07-08 ENCOUNTER — Encounter (HOSPITAL_COMMUNITY): Payer: Self-pay

## 2013-07-09 NOTE — H&P (Signed)
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 Discussed  Bilateral thyroid nodule, larger on the right with suspicious cytology. Recommend thyroid lobectomy with frozen section and possible total thyroidectomy. Surgery was discussed in detail. All questions were answered. A handout was provided.   Continue efforts to eliminate caffeine from her diet. The hoarseness she is experiencing is not from a thyroid nodule that is from reflux. Reason For Visit  Donna Strong is here today at the kind request of Balan, Bindubal for consultation and opinion for thyroid. HPI  Long-standing thyroid nodules being followed. Recent ultrasound revealed growth of the right nodule up to 1.8 cm and bilateral FNAs were performed. The right side had a suspicious cytology. She has a history of heartburn and takes TUMS with irregularity. She is currently working on cutting back on caffeine. She has some intermittent hoarseness. Allergies  Codeine Derivatives. Current Meds  Synthroid 50 MCG Oral Tablet (Levothyroxine Sodium);; RPT Bromocriptine Mesylate 2.5 MG Oral Tablet;; RPT ProAir HFA 108 (90 Base) MCG/ACT Inhalation Aerosol Solution;; RPT. Active Problems  Hyperthyroidism   (242.90) (E05.90). PMH  History of vascular surgery (V45.89) (Z98.89). PSH  Hysterectomy. Family Hx  Cancer of neck: Mother (C76.0) Family history of malignant neoplasm of breast: Cousin (V16.3) (Z80.3). Personal Hx  Caffeine use (V49.89) (Z78.9); 2 cups daily Never smoker No alcohol use Non-smoker (V49.89) (Z78.9). ROS  Systemic: Not feeling tired (fatigue).  No fever, no night sweats, and no recent weight loss. Head: No headache. Eyes: No eye symptoms. Otolaryngeal: No hearing loss.  Earache.  No tinnitus  and no purulent nasal discharge.  No nasal passage blockage (stuffiness), no snoring, and no sneezing.  Hoarseness.  No sore throat. Cardiovascular: No chest pain or  discomfort  and no palpitations. Pulmonary: No dyspnea.  Cough.  No wheezing. Gastrointestinal: No dysphagia  and no heartburn.  No nausea, no abdominal pain, and no melena.  No diarrhea. Genitourinary: No dysuria. Endocrine: No muscle weakness. Musculoskeletal: No calf muscle cramps, no arthralgias, and no soft tissue swelling. Neurological: No dizziness, no fainting, no tingling, and no numbness. Psychological: No anxiety  and no depression. Skin: No rash. 12 system ROS was obtained and reviewed on the Health Maintenance form dated today.  Positive responses are shown above.  If the symptom is not checked, the patient has denied it. Vital Signs   Recorded by Skolimowski,Sharon on 25 Jun 2013 02:02 PM BP:126/84,  Height: 5 ft 11 in, Weight: 195 lb , BMI: 27.2 kg/m2,  BMI Calculated: 27.20 ,  BSA Calculated: 2.09. Physical Exam  APPEARANCE: Well developed, well nourished, in no acute distress.  Normal affect, in a pleasant mood.  Oriented to time, place and person. COMMUNICATION: Normal voice   HEAD & FACE:  No scars, lesions or masses of head and face.  Sinuses nontender to palpation.  Salivary glands without mass or tenderness.  Facial strength symmetric.  No facial lesion, scars, or mass. EYES: EOMI with normal primary gaze alignment. Visual acuity grossly intact.  PERRLA EXTERNAL EAR & NOSE: No scars, lesions or masses  EAC & TYMPANIC MEMBRANE:  EAC shows no obstructing lesions or debris and tympanic membranes are normal bilaterally with good movement to insufflation. GROSS HEARING: Normal  TMJ:  Nontender  INTRANASAL EXAM: No polyps or purulence.  NASOPHARYNX: Normal, without lesions. LIPS, TEETH & GUMS: No lip lesions, normal dentition and normal gums. ORAL CAVITY/OROPHARYNX:  Oral mucosa moist without lesion or asymmetry of the palate, tongue, tonsil or posterior  pharynx. LARYNX (mirror exam):  No lesions of the epiglottis, false cord or TVC's and cords move well to  phonation. HYPOPHARYNX (mirror exam): No lesions, asymmetry or pooling of secretions. NECK:  Supple without adenopathy or mass. THYROID:  Normal with no masses palpable, she has a thick neck which makes it somewhat difficult to feel small nodules.  NEUROLOGIC:  No gross CN deficits. No nystagmus noted.   LYMPHATIC:  No enlarged nodes palpable. Signature  Electronically signed by : Izora Gala  M.D.; 06/25/2013 2:27 PM EST.

## 2013-07-10 ENCOUNTER — Encounter (HOSPITAL_COMMUNITY)
Admission: RE | Admit: 2013-07-10 | Discharge: 2013-07-10 | Disposition: A | Discharge status: Home / Self Care | Payer: 59 | Source: Ambulatory Visit | Attending: Otolaryngology | Admitting: Otolaryngology

## 2013-07-10 ENCOUNTER — Encounter (HOSPITAL_COMMUNITY): Payer: Self-pay

## 2013-07-10 ENCOUNTER — Ambulatory Visit (HOSPITAL_COMMUNITY)
Admission: RE | Admit: 2013-07-10 | Discharge: 2013-07-10 | Disposition: A | Discharge status: Home / Self Care | Payer: 59 | Source: Ambulatory Visit | Attending: Otolaryngology | Admitting: Otolaryngology

## 2013-07-10 DIAGNOSIS — Z01818 Encounter for other preprocedural examination: Secondary | ICD-10-CM

## 2013-07-10 HISTORY — DX: Neoplasm related pain (acute) (chronic): G89.3

## 2013-07-10 LAB — BASIC METABOLIC PANEL
BUN: 7 mg/dL (ref 6–23)
CO2: 25 mEq/L (ref 19–32)
Calcium: 9.4 mg/dL (ref 8.4–10.5)
Chloride: 104 mEq/L (ref 96–112)
Creatinine, Ser: 0.71 mg/dL (ref 0.50–1.10)
GFR calc Af Amer: >90 mL/min (ref >90)
GLUCOSE: 95 mg/dL (ref 70–99)
POTASSIUM: 3.5 meq/L — AB (ref 3.7–5.3)
SODIUM: 142 meq/L (ref 137–147)

## 2013-07-10 LAB — CBC
HEMATOCRIT: 32.7 % — AB (ref 36.0–46.0)
Hemoglobin: 11.2 g/dL — ABNORMAL LOW (ref 12.0–15.0)
MCH: 29.9 pg (ref 26.0–34.0)
MCHC: 34.3 g/dL (ref 30.0–36.0)
MCV: 87.4 fL (ref 78.0–100.0)
Platelets: 368 10*3/uL (ref 150–400)
RBC: 3.74 MIL/uL — ABNORMAL LOW (ref 3.87–5.11)
RDW: 12.8 % (ref 11.5–15.5)
WBC: 8 10*3/uL (ref 4.0–10.5)

## 2013-07-10 MED ORDER — CEFAZOLIN SODIUM-DEXTROSE 2-3 GM-% IV SOLR
2.0000 g | INTRAVENOUS | Status: AC
  Administered 2013-07-11: 2 g via INTRAVENOUS
  Filled 2013-07-10: qty 50

## 2013-07-10 NOTE — Pre-Procedure Instructions (Signed)
ROSEMARIA INABINET  07/10/2013   Your procedure is scheduled on:  07/11/13  Report to [facility]  2 * 3 at 530 AM.  Call this number if you have problems the morning of surgery: [phone]   Remember:   Do not eat food or drink liquids after midnight.   Take these medicines the morning of surgery with A SIP OF WATER: synthroid,parlodel   Do not wear jewelry, make-up or nail polish.  Do not wear lotions, powders, or perfumes. You may wear deodorant.  Do not shave 48 hours prior to surgery. Men may shave face and neck.  Do not bring valuables to the hospital.  [facility] is not responsible                  for any belongings or valuables.               Contacts, dentures or bridgework may not be worn into surgery.  Leave suitcase in the car. After surgery it may be brought to your room.  For patients admitted to the hospital, discharge time is determined by your                treatment team.               Patients discharged the day of surgery will not be allowed to drive  home.  Name and phone number of your driver: family  Special Instructions: Shower using CHG 2 nights before surgery and the night before surgery.  If you shower the day of surgery use CHG.  Use special wash - you have one bottle of CHG for all showers.  You should use approximately 1/3 of the bottle for each shower.   Please read over the following fact sheets that you were given: Pain Booklet, Coughing and Deep Breathing and Surgical Site Infection Prevention

## 2013-07-11 ENCOUNTER — Ambulatory Visit (HOSPITAL_COMMUNITY): Payer: 59 | Admitting: Certified Registered"

## 2013-07-11 ENCOUNTER — Observation Stay (HOSPITAL_COMMUNITY)
Admission: RE | Admit: 2013-07-11 | Discharge: 2013-07-12 | Disposition: A | Discharge status: Home / Self Care | Payer: 59 | Source: Ambulatory Visit | Attending: Otolaryngology | Admitting: Otolaryngology

## 2013-07-11 ENCOUNTER — Encounter (HOSPITAL_COMMUNITY): Payer: 59 | Admitting: Certified Registered"

## 2013-07-11 ENCOUNTER — Encounter (HOSPITAL_COMMUNITY): Payer: Self-pay | Admitting: *Deleted

## 2013-07-11 ENCOUNTER — Encounter (HOSPITAL_COMMUNITY)
Admission: RE | Disposition: A | Discharge status: Home / Self Care | Payer: Self-pay | Source: Ambulatory Visit | Attending: Otolaryngology

## 2013-07-11 DIAGNOSIS — E890 Postprocedural hypothyroidism: Secondary | ICD-10-CM

## 2013-07-11 DIAGNOSIS — E039 Hypothyroidism, unspecified: Secondary | ICD-10-CM | POA: Insufficient documentation

## 2013-07-11 DIAGNOSIS — E041 Nontoxic single thyroid nodule: Principal | ICD-10-CM | POA: Insufficient documentation

## 2013-07-11 DIAGNOSIS — Z9889 Other specified postprocedural states: Secondary | ICD-10-CM

## 2013-07-11 HISTORY — PX: THYROIDECTOMY: SHX17

## 2013-07-11 SURGERY — THYROIDECTOMY
Anesthesia: General | Site: Neck | Laterality: Right

## 2013-07-11 MED ORDER — LIDOCAINE HCL (CARDIAC) 20 MG/ML IV SOLN
INTRAVENOUS | Status: AC
  Filled 2013-07-11: qty 5

## 2013-07-11 MED ORDER — MIDAZOLAM HCL 5 MG/5ML IJ SOLN
INTRAMUSCULAR | Status: DC | PRN
  Administered 2013-07-11: 2 mg via INTRAVENOUS

## 2013-07-11 MED ORDER — ONDANSETRON HCL 4 MG/2ML IJ SOLN
INTRAMUSCULAR | Status: DC | PRN
  Administered 2013-07-11: 4 mg via INTRAVENOUS

## 2013-07-11 MED ORDER — SUFENTANIL CITRATE 50 MCG/ML IV SOLN
INTRAVENOUS | Status: AC
  Filled 2013-07-11: qty 1

## 2013-07-11 MED ORDER — ADULT MULTIVITAMIN W/MINERALS CH
1.0000 | ORAL_TABLET | Freq: Every day | ORAL | Status: DC
  Administered 2013-07-11 – 2013-07-12 (×2): 1 via ORAL
  Filled 2013-07-11 (×2): qty 1

## 2013-07-11 MED ORDER — DEXAMETHASONE SODIUM PHOSPHATE 10 MG/ML IJ SOLN
INTRAMUSCULAR | Status: DC | PRN
  Administered 2013-07-11: 10 mg via INTRAVENOUS

## 2013-07-11 MED ORDER — HYDROMORPHONE HCL PF 1 MG/ML IJ SOLN
INTRAMUSCULAR | Status: AC
  Filled 2013-07-11: qty 2

## 2013-07-11 MED ORDER — LACTATED RINGERS IV SOLN
INTRAVENOUS | Status: DC | PRN
  Administered 2013-07-11: 07:00:00 via INTRAVENOUS

## 2013-07-11 MED ORDER — LIDOCAINE-EPINEPHRINE 1 %-1:100000 IJ SOLN
INTRAMUSCULAR | Status: AC
  Filled 2013-07-11: qty 1

## 2013-07-11 MED ORDER — ONDANSETRON HCL 4 MG/2ML IJ SOLN
INTRAMUSCULAR | Status: AC
  Filled 2013-07-11: qty 2

## 2013-07-11 MED ORDER — LIDOCAINE HCL (CARDIAC) 20 MG/ML IV SOLN
INTRAVENOUS | Status: DC | PRN
  Administered 2013-07-11: 70 mg via INTRAVENOUS

## 2013-07-11 MED ORDER — PROPOFOL 10 MG/ML IV BOLUS
INTRAVENOUS | Status: AC
  Filled 2013-07-11: qty 20

## 2013-07-11 MED ORDER — PROMETHAZINE HCL 25 MG RE SUPP: 25.0000 mg | Freq: Four times a day (QID) | RECTAL | Status: DC | PRN

## 2013-07-11 MED ORDER — MIDAZOLAM HCL 2 MG/2ML IJ SOLN
INTRAMUSCULAR | Status: AC
  Filled 2013-07-11: qty 2

## 2013-07-11 MED ORDER — ONDANSETRON HCL 4 MG/2ML IJ SOLN
4.0000 mg | Freq: Four times a day (QID) | INTRAMUSCULAR | Status: AC | PRN
  Administered 2013-07-11: 4 mg via INTRAVENOUS

## 2013-07-11 MED ORDER — SUCCINYLCHOLINE CHLORIDE 20 MG/ML IJ SOLN
INTRAMUSCULAR | Status: AC
  Filled 2013-07-11: qty 1

## 2013-07-11 MED ORDER — PROPOFOL 10 MG/ML IV BOLUS
INTRAVENOUS | Status: DC | PRN
  Administered 2013-07-11: 200 mg via INTRAVENOUS

## 2013-07-11 MED ORDER — GLYCOPYRROLATE 0.2 MG/ML IJ SOLN
INTRAMUSCULAR | Status: DC | PRN
  Administered 2013-07-11: 0.4 mg via INTRAVENOUS

## 2013-07-11 MED ORDER — HYDROMORPHONE HCL PF 1 MG/ML IJ SOLN
0.2500 mg | INTRAMUSCULAR | Status: DC | PRN
  Administered 2013-07-11 (×4): 0.5 mg via INTRAVENOUS

## 2013-07-11 MED ORDER — OXYCODONE HCL 5 MG PO TABS
ORAL_TABLET | ORAL | Status: AC
  Filled 2013-07-11: qty 1

## 2013-07-11 MED ORDER — HYDROCODONE-ACETAMINOPHEN 7.5-325 MG PO TABS
1.0000 | ORAL_TABLET | Freq: Four times a day (QID) | ORAL | Status: DC | PRN
Start: 2013-07-11 — End: 2018-11-01

## 2013-07-11 MED ORDER — IBUPROFEN 100 MG/5ML PO SUSP
400.0000 mg | Freq: Four times a day (QID) | ORAL | Status: DC | PRN
  Filled 2013-07-11: qty 20

## 2013-07-11 MED ORDER — HYDROCODONE-ACETAMINOPHEN 5-325 MG PO TABS
1.0000 | ORAL_TABLET | ORAL | Status: DC | PRN
  Administered 2013-07-11: 1 via ORAL
  Administered 2013-07-12 (×2): 2 via ORAL
  Filled 2013-07-11: qty 1
  Filled 2013-07-11 (×2): qty 2

## 2013-07-11 MED ORDER — OXYCODONE HCL 5 MG PO TABS
5.0000 mg | ORAL_TABLET | Freq: Once | ORAL | Status: AC | PRN
  Administered 2013-07-11: 5 mg via ORAL

## 2013-07-11 MED ORDER — DEXTROSE-NACL 5-0.9 % IV SOLN
INTRAVENOUS | Status: DC
  Administered 2013-07-11 – 2013-07-12 (×2): via INTRAVENOUS

## 2013-07-11 MED ORDER — ROCURONIUM BROMIDE 100 MG/10ML IV SOLN
INTRAVENOUS | Status: DC | PRN
  Administered 2013-07-11: 50 mg via INTRAVENOUS

## 2013-07-11 MED ORDER — ROCURONIUM BROMIDE 50 MG/5ML IV SOLN
INTRAVENOUS | Status: AC
  Filled 2013-07-11: qty 1

## 2013-07-11 MED ORDER — 0.9 % SODIUM CHLORIDE (POUR BTL) OPTIME
TOPICAL | Status: DC | PRN
  Administered 2013-07-11: 1000 mL

## 2013-07-11 MED ORDER — SCOPOLAMINE 1 MG/3DAYS TD PT72
MEDICATED_PATCH | TRANSDERMAL | Status: AC
  Administered 2013-07-11: 1 via TRANSDERMAL
  Filled 2013-07-11: qty 1

## 2013-07-11 MED ORDER — LEVOTHYROXINE SODIUM 50 MCG PO TABS
50.0000 ug | ORAL_TABLET | Freq: Every day | ORAL | Status: DC
Start: 2013-07-12 — End: 2013-07-12
  Administered 2013-07-12: 50 ug via ORAL
  Filled 2013-07-11: qty 1

## 2013-07-11 MED ORDER — BACITRACIN ZINC 500 UNIT/GM EX OINT
TOPICAL_OINTMENT | CUTANEOUS | Status: AC
  Filled 2013-07-11: qty 15

## 2013-07-11 MED ORDER — GLYCOPYRROLATE 0.2 MG/ML IJ SOLN
INTRAMUSCULAR | Status: AC
  Filled 2013-07-11: qty 2

## 2013-07-11 MED ORDER — NEOSTIGMINE METHYLSULFATE 1 MG/ML IJ SOLN
INTRAMUSCULAR | Status: AC
Start: 2013-07-11 — End: 2013-07-11
  Filled 2013-07-11: qty 10

## 2013-07-11 MED ORDER — NEOSTIGMINE METHYLSULFATE 1 MG/ML IJ SOLN
INTRAMUSCULAR | Status: DC | PRN
Start: 2013-07-11 — End: 2013-07-11
  Administered 2013-07-11: 3 mg via INTRAVENOUS

## 2013-07-11 MED ORDER — SUFENTANIL CITRATE 50 MCG/ML IV SOLN
INTRAVENOUS | Status: DC | PRN
  Administered 2013-07-11: 10 ug via INTRAVENOUS
  Administered 2013-07-11: 20 ug via INTRAVENOUS

## 2013-07-11 MED ORDER — OXYCODONE HCL 5 MG/5ML PO SOLN: 5.0000 mg | Freq: Once | ORAL | Status: AC | PRN

## 2013-07-11 MED ORDER — BROMOCRIPTINE MESYLATE 2.5 MG PO TABS
2.5000 mg | ORAL_TABLET | Freq: Every day | ORAL | Status: DC
  Administered 2013-07-11: 2.5 mg via ORAL
  Filled 2013-07-11 (×3): qty 1

## 2013-07-11 MED ORDER — SODIUM CHLORIDE 0.9 % IJ SOLN
INTRAMUSCULAR | Status: AC
  Filled 2013-07-11: qty 10

## 2013-07-11 MED ORDER — DEXAMETHASONE SODIUM PHOSPHATE 10 MG/ML IJ SOLN
INTRAMUSCULAR | Status: AC
  Filled 2013-07-11: qty 1

## 2013-07-11 MED ORDER — MORPHINE SULFATE 2 MG/ML IJ SOLN
2.0000 mg | INTRAMUSCULAR | Status: DC | PRN
  Administered 2013-07-11 (×3): 2 mg via INTRAVENOUS
  Filled 2013-07-11 (×3): qty 1

## 2013-07-11 MED ORDER — PROMETHAZINE HCL 25 MG RE SUPP
25.0000 mg | Freq: Four times a day (QID) | RECTAL | Status: DC | PRN
  Administered 2013-07-11: 25 mg via RECTAL
  Filled 2013-07-11: qty 1

## 2013-07-11 MED ORDER — PROMETHAZINE HCL 25 MG PO TABS: 25.0000 mg | ORAL_TABLET | Freq: Four times a day (QID) | ORAL | Status: DC | PRN

## 2013-07-11 SURGICAL SUPPLY — 44 items
ADH SKN CLS APL DERMABOND .7 (GAUZE/BANDAGES/DRESSINGS) ×1
ADH SKN CLS LQ APL DERMABOND (GAUZE/BANDAGES/DRESSINGS) ×1
APPLIER CLIP 9.375 SM OPEN (CLIP)
APR CLP SM 9.3 20 MLT OPN (CLIP)
ATTRACTOMAT 16X20 MAGNETIC DRP (DRAPES) IMPLANT
CANISTER SUCTION 2500CC (MISCELLANEOUS) ×3 IMPLANT
CLEANER TIP ELECTROSURG 2X2 (MISCELLANEOUS) ×3 IMPLANT
CLIP APPLIE 9.375 SM OPEN (CLIP) IMPLANT
CORDS BIPOLAR (ELECTRODE) ×3 IMPLANT
COVER SURGICAL LIGHT HANDLE (MISCELLANEOUS) ×3 IMPLANT
DERMABOND ADHESIVE PROPEN (GAUZE/BANDAGES/DRESSINGS) ×2
DERMABOND ADVANCED (GAUZE/BANDAGES/DRESSINGS) ×2
DERMABOND ADVANCED .7 DNX12 (GAUZE/BANDAGES/DRESSINGS) ×1 IMPLANT
DERMABOND ADVANCED .7 DNX6 (GAUZE/BANDAGES/DRESSINGS) ×1 IMPLANT
DRAIN SNY 10 ROU (WOUND CARE) ×3 IMPLANT
ELECT COATED BLADE 2.86 ST (ELECTRODE) ×3 IMPLANT
ELECT REM PT RETURN 9FT ADLT (ELECTROSURGICAL) ×3
ELECTRODE REM PT RTRN 9FT ADLT (ELECTROSURGICAL) ×1 IMPLANT
EVACUATOR SILICONE 100CC (DRAIN) ×3 IMPLANT
FORCEPS BIPOLAR SPETZLER 8 1.0 (NEUROSURGERY SUPPLIES) ×3 IMPLANT
GAUZE SPONGE 4X4 16PLY XRAY LF (GAUZE/BANDAGES/DRESSINGS) IMPLANT
GLOVE BIO SURGEON STRL SZ 6.5 (GLOVE) ×6 IMPLANT
GLOVE BIO SURGEONS STRL SZ 6.5 (GLOVE) ×3
GLOVE BIOGEL PI IND STRL 7.0 (GLOVE) ×1 IMPLANT
GLOVE BIOGEL PI INDICATOR 7.0 (GLOVE) ×2
GLOVE ECLIPSE 7.5 STRL STRAW (GLOVE) ×3 IMPLANT
GOWN STRL REUS W/ TWL LRG LVL3 (GOWN DISPOSABLE) ×2 IMPLANT
GOWN STRL REUS W/TWL LRG LVL3 (GOWN DISPOSABLE) ×6
KIT BASIN OR (CUSTOM PROCEDURE TRAY) ×3 IMPLANT
KIT ROOM TURNOVER OR (KITS) ×3 IMPLANT
NEEDLE 27GAX1X1/2 (NEEDLE) IMPLANT
NS IRRIG 1000ML POUR BTL (IV SOLUTION) ×3 IMPLANT
PAD ARMBOARD 7.5X6 YLW CONV (MISCELLANEOUS) ×6 IMPLANT
PENCIL FOOT CONTROL (ELECTRODE) ×3 IMPLANT
SPONGE INTESTINAL PEANUT (DISPOSABLE) IMPLANT
STAPLER VISISTAT 35W (STAPLE) ×3 IMPLANT
SUT CHROMIC 4 0 PS 2 18 (SUTURE) ×6 IMPLANT
SUT ETHILON 3 0 PS 1 (SUTURE) ×3 IMPLANT
SUT SILK 3 0 REEL (SUTURE) IMPLANT
SUT SILK 4 0 P 3 (SUTURE) IMPLANT
SUT SILK 4 0 REEL (SUTURE) ×3 IMPLANT
TOWEL OR 17X24 6PK STRL BLUE (TOWEL DISPOSABLE) ×3 IMPLANT
TOWEL OR 17X26 10 PK STRL BLUE (TOWEL DISPOSABLE) ×3 IMPLANT
TRAY ENT MC OR (CUSTOM PROCEDURE TRAY) ×3 IMPLANT

## 2013-07-11 NOTE — Discharge Instructions (Signed)
It is okay to use soap and water on the incision but do not use any cream, oils, or ointments.

## 2013-07-11 NOTE — Anesthesia Postprocedure Evaluation (Signed)
Anesthesia Post Note  Patient: Donna Strong  Procedure(s) Performed: Procedure(s) (LRB): RIGHT THYROID LOBECTOMY WITH FROZEN SECTION (Right)  Anesthesia type: General  Patient location: PACU  Post pain: Pain level controlled and Adequate analgesia  Post assessment: Post-op Vital signs reviewed, Patient's Cardiovascular Status Stable, Respiratory Function Stable, Patent Airway and Pain level controlled  Last Vitals:  Filed Vitals:   07/11/13 1000  BP:   Pulse: 78  Temp:   Resp: 21    Post vital signs: Reviewed and stable  Level of consciousness: awake, alert  and oriented  Complications: No apparent anesthesia complications

## 2013-07-11 NOTE — Progress Notes (Signed)
Care of pt assumed by MA Sable Knoles RN 

## 2013-07-11 NOTE — Interval H&P Note (Signed)
History and Physical Interval Note:  07/11/2013 7:24 AM  Donna Strong  has presented today for surgery, with the diagnosis of THYROID NODULE  The various methods of treatment have been discussed with the patient and family. After consideration of risks, benefits and other options for treatment, the patient has consented to  Procedure(s): RIGHT THYROID LOBECTOMY WITH FROZEN SECTION/POSSIBLE TOTAL (Right) as a surgical intervention .  The patient's history has been reviewed, patient examined, no change in status, stable for surgery.  I have reviewed the patient's chart and labs.  Questions were answered to the patient's satisfaction.     Izora Gala

## 2013-07-11 NOTE — Progress Notes (Signed)
Patient ID: Donna Strong, female   DOB: 04-13-1960, 53 y.o.   MRN: [id number] Complains only of some neck soreness. AF VSS. Normal voice.  Neck incision clean and intact, no fluid collection.  Drain functioning. S/p thyroid lobectomy Observe overnight with drain in place.

## 2013-07-11 NOTE — Transfer of Care (Signed)
Immediate Anesthesia Transfer of Care Note  Patient: Donna Strong  Procedure(s) Performed: Procedure(s): RIGHT THYROID LOBECTOMY WITH FROZEN SECTION (Right)  Patient Location: PACU  Anesthesia Type:General  Level of Consciousness: sedated  Airway & Oxygen Therapy: Patient Spontanous Breathing and Patient connected to nasal cannula oxygen  Post-op Assessment: Report given to PACU RN, Post -op Vital signs reviewed and stable and Patient moving all extremities  Post vital signs: Reviewed and stable  Complications: No apparent anesthesia complications

## 2013-07-11 NOTE — Anesthesia Preprocedure Evaluation (Addendum)
Anesthesia Evaluation  Patient identified by MRN, date of birth, ID band Patient awake    Reviewed: Allergy & Precautions, H&P , NPO status , Patient's Chart, lab work & pertinent test results  Airway Mallampati: II TM Distance: >3 FB Neck ROM: full    Dental  (+) Teeth Intact, Dental Advisory Given   Pulmonary neg pulmonary ROS,          Cardiovascular negative cardio ROS      Neuro/Psych    GI/Hepatic   Endo/Other  Hypothyroidism Thyroid nodule  Renal/GU      Musculoskeletal   Abdominal   Peds  Hematology   Anesthesia Other Findings   Reproductive/Obstetrics                          Anesthesia Physical Anesthesia Plan  ASA: II  Anesthesia Plan: General   Post-op Pain Management:    Induction: Intravenous  Airway Management Planned: Oral ETT  Additional Equipment: None  Intra-op Plan:   Post-operative Plan: Extubation in OR  Informed Consent: I have reviewed the patients History and Physical, chart, labs and discussed the procedure including the risks, benefits and alternatives for the proposed anesthesia with the patient or authorized representative who has indicated his/her understanding and acceptance.   Dental advisory given  Plan Discussed with: CRNA, Anesthesiologist and Surgeon  Anesthesia Plan Comments:        Anesthesia Quick Evaluation

## 2013-07-11 NOTE — Op Note (Signed)
OPERATIVE REPORT  DATE OF SURGERY: 07/11/2013  PATIENT:  Donna Strong,  53 y.o. female  PRE-OPERATIVE DIAGNOSIS:   THYROID NODULE,RIGHT  POST-OPERATIVE DIAGNOSIS:   THYROID NODULE,RIGHT  PROCEDURE:  Procedure(s): RIGHT THYROID LOBECTOMY WITH FROZEN SECTION  SURGEON:  Beckie Salts, MD  ASSISTANTS: Jolene Provost, PA  ANESTHESIA:   General   EBL:  35 ml  DRAINS: 10 French round J-P  LOCAL MEDICATIONS USED:  None  SPECIMEN:  Right thyroid lobe  COUNTS:  Correct  PROCEDURE DETAILS: The patient was taken to the operating room and placed on the operating table in the supine position. A shoulder roll was placed beneath the shoulder blades and the neck was extended. The neck was prepped and draped in a standard fashion. A low collar transverse incision was outlined marking pen and was incised with electrocautery. Dissection was continued down through the platysma layer. Subplatysmal flaps were elevated superiorly to the thyroid cartilage and inferiorly to the clavicle. Self-retaining thyroid retractor was used throughout the case.  The midline fascia was divided. The strap muscles were reflected laterally off the right lobe of the thyroid. The right lobe of the thyroid was reflected towards the left side. The superior vasculature was dissected first. Vessels were separately identified, ligated between clamps and divided. 4-0 silk ties were used. The middle thyroid vein was ligated as well. As the gland was brought forward the recurrent laryngeal nerve was identified and preserved. The lower vasculature was ligated. The ligament of Gwenlyn Found was divided as the gland was brought forward off the trachea. The isthmus was divided using electrocautery and a silk tie. The right lobe was sent for pathologic evaluation. A solitary 1-1/2 cm nodule was present within the gland. Frozen section was consistent with a follicular lesion but not diagnostic for carcinoma. A suspected inferior parathyroid  gland was identified and preserved with its blood supply. The wound was irrigated with saline. Hemostasis was completed using additional 4-0 silk and bipolar cautery. A 10 French round J-P drain was left in the wound exiting through a stab incision inferior to the skin incision and secured in place with nylon suture. The midline fascia was reapproximated with chromic suture. The platysma layer was reapproximated as well. A subcuticular running closure with 4-0 chromic was accomplished and Dermabond was used on the skin. The patient was then awakened extubated and transferred to recovery in stable condition.   PATIENT DISPOSITION:  To PACU, stable

## 2013-07-12 NOTE — Discharge Summary (Signed)
Physician Discharge Summary  Patient ID: Donna Strong MRN: [id number] DOB/AGE: September 10, 1960 53 y.o.  Admit date: 07/11/2013 Discharge date: 07/12/2013  Admission Diagnoses:Thyroid mass  Discharge Diagnoses:  Active Problems:   S/P thyroidectomy   Discharged Condition: good  Hospital Course: no cmplications  Consults: none  Significant Diagnostic Studies: none  Treatments: surgery: thyroid lobectomy  Discharge Exam: Blood pressure 116/64, pulse 92, temperature 98.6 F (37 C), temperature source Oral, resp. rate 18, height 5\' 11"  (1.803 m), weight 202 lb (91.627 kg), SpO2 94.00%. PHYSICAL EXAM: Voice strong, incision excellent. JP removed.  Disposition: 01-Home or Self Care  Discharge Orders   Future Orders Complete By Expires   Diet - low sodium heart healthy  As directed    Increase activity slowly  As directed        Medication List         bromocriptine 2.5 MG tablet  Commonly known as:  PARLODEL  Take 2.5 mg by mouth daily.     HYDROcodone-acetaminophen 7.5-325 MG per tablet  Commonly known as:  NORCO  Take 1 tablet by mouth every 6 (six) hours as needed for moderate pain.     levothyroxine 50 MCG tablet  Commonly known as:  SYNTHROID, LEVOTHROID  Take 50 mcg by mouth daily before breakfast.     multivitamin with minerals tablet  Take 1 tablet by mouth daily.     promethazine 25 MG suppository  Commonly known as:  PHENERGAN  Place 1 suppository (25 mg total) rectally every 6 (six) hours as needed for nausea or vomiting.           Follow-up Information   Follow up with Izora Gala, MD. Schedule an appointment as soon as possible for a visit in 1 week.   Specialty:  Otolaryngology   Contact information:   [address] [place] [address] [phone]       Signed: Izora Gala 07/12/2013, 8:37 AM

## 2013-07-12 NOTE — Progress Notes (Signed)
Discharge instructions reviewed with patient, questions answered, verbalized understanding.  Patient given written prescriptions for pain medication and Phenergan.  Patient transported to front of hospital to be taken home by friend and mother.  Patient in good condition at time of discharge from [facility].

## 2013-07-15 ENCOUNTER — Encounter (HOSPITAL_COMMUNITY): Payer: Self-pay | Admitting: Otolaryngology

## 2013-08-21 ENCOUNTER — Other Ambulatory Visit: Payer: Self-pay | Admitting: Otolaryngology

## 2013-08-21 DIAGNOSIS — K118 Other diseases of salivary glands: Secondary | ICD-10-CM

## 2013-08-22 ENCOUNTER — Ambulatory Visit
Admission: RE | Admit: 2013-08-22 | Discharge: 2013-08-22 | Disposition: A | Discharge status: Home / Self Care | Payer: 59 | Source: Ambulatory Visit | Attending: Otolaryngology | Admitting: Otolaryngology

## 2013-08-22 DIAGNOSIS — K118 Other diseases of salivary glands: Secondary | ICD-10-CM

## 2013-08-22 MED ORDER — IOHEXOL 300 MG/ML  SOLN
75.0000 mL | Freq: Once | INTRAMUSCULAR | Status: AC | PRN
  Administered 2013-08-22: 75 mL via INTRAVENOUS

## 2013-12-19 ENCOUNTER — Other Ambulatory Visit: Payer: Self-pay | Admitting: Otolaryngology

## 2013-12-19 ENCOUNTER — Ambulatory Visit
Admission: RE | Admit: 2013-12-19 | Discharge: 2013-12-19 | Disposition: A | Discharge status: Home / Self Care | Payer: 59 | Source: Ambulatory Visit | Attending: Otolaryngology | Admitting: Otolaryngology

## 2013-12-19 DIAGNOSIS — M542 Cervicalgia: Secondary | ICD-10-CM

## 2013-12-30 ENCOUNTER — Other Ambulatory Visit (HOSPITAL_COMMUNITY): Payer: Self-pay | Admitting: Obstetrics and Gynecology

## 2013-12-30 DIAGNOSIS — Z1231 Encounter for screening mammogram for malignant neoplasm of breast: Secondary | ICD-10-CM

## 2014-01-13 ENCOUNTER — Ambulatory Visit (HOSPITAL_COMMUNITY)
Admission: RE | Admit: 2014-01-13 | Discharge: 2014-01-13 | Disposition: A | Discharge status: Home / Self Care | Payer: 59 | Source: Ambulatory Visit | Attending: Obstetrics and Gynecology | Admitting: Obstetrics and Gynecology

## 2014-01-13 DIAGNOSIS — Z1231 Encounter for screening mammogram for malignant neoplasm of breast: Secondary | ICD-10-CM | POA: Diagnosis present

## 2014-01-20 ENCOUNTER — Other Ambulatory Visit (HOSPITAL_COMMUNITY): Payer: Self-pay | Admitting: Obstetrics and Gynecology

## 2014-01-20 DIAGNOSIS — N6489 Other specified disorders of breast: Secondary | ICD-10-CM

## 2014-01-28 ENCOUNTER — Ambulatory Visit (HOSPITAL_COMMUNITY)
Admission: RE | Admit: 2014-01-28 | Discharge: 2014-01-28 | Disposition: A | Discharge status: Home / Self Care | Payer: 59 | Source: Ambulatory Visit | Attending: Obstetrics and Gynecology | Admitting: Obstetrics and Gynecology

## 2014-01-28 DIAGNOSIS — N6489 Other specified disorders of breast: Secondary | ICD-10-CM | POA: Insufficient documentation

## 2014-07-12 HISTORY — PX: THYROID LOBECTOMY: SHX420

## 2014-09-24 ENCOUNTER — Other Ambulatory Visit: Payer: Self-pay | Admitting: Endocrinology

## 2014-09-24 DIAGNOSIS — E041 Nontoxic single thyroid nodule: Secondary | ICD-10-CM

## 2015-01-30 ENCOUNTER — Other Ambulatory Visit: Payer: Self-pay

## 2015-01-30 DIAGNOSIS — Z803 Family history of malignant neoplasm of breast: Secondary | ICD-10-CM

## 2015-01-30 DIAGNOSIS — Z1231 Encounter for screening mammogram for malignant neoplasm of breast: Secondary | ICD-10-CM

## 2015-02-02 ENCOUNTER — Other Ambulatory Visit (HOSPITAL_COMMUNITY): Payer: Self-pay | Admitting: Obstetrics and Gynecology

## 2015-02-02 DIAGNOSIS — Z1231 Encounter for screening mammogram for malignant neoplasm of breast: Secondary | ICD-10-CM

## 2015-02-16 ENCOUNTER — Ambulatory Visit (HOSPITAL_COMMUNITY)
Admission: RE | Admit: 2015-02-16 | Discharge: 2015-02-16 | Disposition: A | Discharge status: Home / Self Care | Payer: BLUE CROSS/BLUE SHIELD | Source: Ambulatory Visit | Attending: Obstetrics and Gynecology | Admitting: Obstetrics and Gynecology

## 2015-02-16 DIAGNOSIS — Z1231 Encounter for screening mammogram for malignant neoplasm of breast: Secondary | ICD-10-CM | POA: Insufficient documentation

## 2015-09-09 ENCOUNTER — Other Ambulatory Visit: Payer: Self-pay

## 2016-02-17 ENCOUNTER — Ambulatory Visit (HOSPITAL_COMMUNITY)
Admission: RE | Admit: 2016-02-17 | Discharge: 2016-02-17 | Disposition: A | Discharge status: Home / Self Care | Payer: BLUE CROSS/BLUE SHIELD | Source: Ambulatory Visit | Attending: Obstetrics and Gynecology | Admitting: Obstetrics and Gynecology

## 2016-02-17 ENCOUNTER — Other Ambulatory Visit (HOSPITAL_COMMUNITY): Payer: Self-pay | Admitting: Obstetrics and Gynecology

## 2016-02-17 DIAGNOSIS — Z1231 Encounter for screening mammogram for malignant neoplasm of breast: Secondary | ICD-10-CM | POA: Insufficient documentation

## 2016-04-27 ENCOUNTER — Ambulatory Visit: Payer: BLUE CROSS/BLUE SHIELD | Admitting: Neurology

## 2016-05-02 ENCOUNTER — Ambulatory Visit: Payer: BLUE CROSS/BLUE SHIELD | Admitting: Neurology

## 2017-01-02 ENCOUNTER — Ambulatory Visit (INDEPENDENT_AMBULATORY_CARE_PROVIDER_SITE_OTHER): Payer: BLUE CROSS/BLUE SHIELD | Admitting: Podiatry

## 2017-01-02 ENCOUNTER — Ambulatory Visit (INDEPENDENT_AMBULATORY_CARE_PROVIDER_SITE_OTHER): Payer: BLUE CROSS/BLUE SHIELD

## 2017-01-02 ENCOUNTER — Encounter: Payer: Self-pay | Admitting: Podiatry

## 2017-01-02 VITALS — BP 133/85 | HR 72 | Resp 16

## 2017-01-02 DIAGNOSIS — M2041 Other hammer toe(s) (acquired), right foot: Secondary | ICD-10-CM | POA: Diagnosis not present

## 2017-01-02 DIAGNOSIS — L84 Corns and callosities: Secondary | ICD-10-CM

## 2017-01-02 DIAGNOSIS — M2042 Other hammer toe(s) (acquired), left foot: Secondary | ICD-10-CM

## 2017-01-02 NOTE — Progress Notes (Signed)
Subjective:    Patient ID: Donna Strong, female   DOB: 56 y.o.   MRN: [id number]   HPI patient presents with painful calluses bilateral especially on the fifth digits bilateral but there is lesions that are very painful when pressed and makes shoe gear difficult and she's tried to trim them herself and she's tried soaking. States they've been present for a fairly long time and patient does not smoke    Review of Systems  All other systems reviewed and are negative.       Objective:  Physical Exam  Constitutional: She appears well-developed and well-nourished.  Cardiovascular: Intact distal pulses.   Pulmonary/Chest: Effort normal.  Musculoskeletal: Normal range of motion.  Neurological: She is alert.  Skin: Skin is warm.  Nursing note and vitals reviewed.  neurovascular status intact muscle strength adequate range of motion within normal limits with rotated fifth digits bilateral with keratotic lesions that are quite tender when pressed on the outside of the toes and keratotic lesions plantar aspect both feet that are only mildly tender     Assessment:  Hammertoe deformity fifth digit bilateral with rotated toes and plantar keratotic lesions bilateral       Plan:    H&P x-rays reviewed conditions discussed. Sharp deep debridement accomplished today with no iatrogenic bleeding and I discussed eventual arthroplasty for the fifth digit bilateral which she would like to get done but we'll need to look at her schedule decide the best time. Patient will call back schedule surgery and be seen by me prior to procedure with education rendered today  X-rays indicate significant rotation fifth digit bilateral with enlargement had a proximal phalanx

## 2017-01-06 ENCOUNTER — Ambulatory Visit: Payer: Self-pay | Admitting: Podiatry

## 2017-03-08 ENCOUNTER — Ambulatory Visit: Payer: BLUE CROSS/BLUE SHIELD | Admitting: Podiatry

## 2017-03-13 ENCOUNTER — Other Ambulatory Visit (HOSPITAL_COMMUNITY): Payer: Self-pay | Admitting: Obstetrics and Gynecology

## 2017-03-13 DIAGNOSIS — Z1231 Encounter for screening mammogram for malignant neoplasm of breast: Secondary | ICD-10-CM

## 2017-04-03 ENCOUNTER — Ambulatory Visit (HOSPITAL_COMMUNITY): Payer: BLUE CROSS/BLUE SHIELD

## 2018-04-02 ENCOUNTER — Ambulatory Visit (INDEPENDENT_AMBULATORY_CARE_PROVIDER_SITE_OTHER): Payer: BLUE CROSS/BLUE SHIELD | Admitting: Otolaryngology

## 2018-04-02 DIAGNOSIS — H9202 Otalgia, left ear: Secondary | ICD-10-CM | POA: Diagnosis not present

## 2018-04-02 DIAGNOSIS — H93293 Other abnormal auditory perceptions, bilateral: Secondary | ICD-10-CM

## 2018-08-24 ENCOUNTER — Other Ambulatory Visit: Payer: Self-pay

## 2018-08-24 ENCOUNTER — Emergency Department (HOSPITAL_COMMUNITY)
Admission: EM | Admit: 2018-08-24 | Discharge: 2018-08-24 | Disposition: A | Discharge status: Home / Self Care | Payer: BLUE CROSS/BLUE SHIELD | Attending: Emergency Medicine | Admitting: Emergency Medicine

## 2018-08-24 ENCOUNTER — Encounter (HOSPITAL_COMMUNITY): Payer: Self-pay | Admitting: Emergency Medicine

## 2018-08-24 ENCOUNTER — Emergency Department (HOSPITAL_COMMUNITY): Payer: BLUE CROSS/BLUE SHIELD

## 2018-08-24 DIAGNOSIS — R1031 Right lower quadrant pain: Secondary | ICD-10-CM | POA: Insufficient documentation

## 2018-08-24 DIAGNOSIS — K5792 Diverticulitis of intestine, part unspecified, without perforation or abscess without bleeding: Secondary | ICD-10-CM

## 2018-08-24 DIAGNOSIS — E039 Hypothyroidism, unspecified: Secondary | ICD-10-CM | POA: Diagnosis not present

## 2018-08-24 DIAGNOSIS — Z79899 Other long term (current) drug therapy: Secondary | ICD-10-CM | POA: Diagnosis not present

## 2018-08-24 LAB — URINALYSIS, ROUTINE W REFLEX MICROSCOPIC
Bilirubin Urine: NEGATIVE
Glucose, UA: NEGATIVE mg/dL
Ketones, ur: NEGATIVE mg/dL
Leukocytes,Ua: NEGATIVE
Nitrite: NEGATIVE
Protein, ur: 100 mg/dL — AB
Specific Gravity, Urine: 1.02 (ref 1.005–1.030)
pH: 5.5 (ref 5.0–8.0)

## 2018-08-24 LAB — CBC WITH DIFFERENTIAL/PLATELET
Abs Immature Granulocytes: 0.04 10*3/uL (ref 0.00–0.07)
Basophils Absolute: 0.1 10*3/uL (ref 0.0–0.1)
Basophils Relative: 0 %
Eosinophils Absolute: 0.1 10*3/uL (ref 0.0–0.5)
Eosinophils Relative: 1 %
HCT: 37.5 % (ref 36.0–46.0)
Hemoglobin: 12.3 g/dL (ref 12.0–15.0)
Immature Granulocytes: 0 %
Lymphocytes Relative: 21 %
Lymphs Abs: 2.7 10*3/uL (ref 0.7–4.0)
MCH: 29.4 pg (ref 26.0–34.0)
MCHC: 32.8 g/dL (ref 30.0–36.0)
MCV: 89.7 fL (ref 80.0–100.0)
Monocytes Absolute: 1 10*3/uL (ref 0.1–1.0)
Monocytes Relative: 8 %
Neutro Abs: 9.1 10*3/uL — ABNORMAL HIGH (ref 1.7–7.7)
Neutrophils Relative %: 70 %
Platelets: 345 10*3/uL (ref 150–400)
RBC: 4.18 MIL/uL (ref 3.87–5.11)
RDW: 12.5 % (ref 11.5–15.5)
WBC: 13 10*3/uL — ABNORMAL HIGH (ref 4.0–10.5)
nRBC: 0 % (ref 0.0–0.2)

## 2018-08-24 LAB — COMPREHENSIVE METABOLIC PANEL
ALT: 31 U/L (ref 0–44)
AST: 21 U/L (ref 15–41)
Albumin: 4.7 g/dL (ref 3.5–5.0)
Alkaline Phosphatase: 90 U/L (ref 38–126)
Anion gap: 10 (ref 5–15)
BUN: 7 mg/dL (ref 6–20)
CO2: 27 mmol/L (ref 22–32)
Calcium: 9.6 mg/dL (ref 8.9–10.3)
Chloride: 104 mmol/L (ref 98–111)
Creatinine, Ser: 0.83 mg/dL (ref 0.44–1.00)
GFR calc Af Amer: >60 mL/min (ref >60)
GFR calc non Af Amer: >60 mL/min (ref >60)
Glucose, Bld: 95 mg/dL (ref 70–99)
Potassium: 3.6 mmol/L (ref 3.5–5.1)
Sodium: 141 mmol/L (ref 135–145)
Total Bilirubin: 0.5 mg/dL (ref 0.3–1.2)
Total Protein: 8.3 g/dL — ABNORMAL HIGH (ref 6.5–8.1)

## 2018-08-24 LAB — POC URINE PREG, ED: Preg Test, Ur: NEGATIVE

## 2018-08-24 LAB — URINALYSIS, MICROSCOPIC (REFLEX)

## 2018-08-24 LAB — LIPASE, BLOOD: Lipase: 39 U/L (ref 11–51)

## 2018-08-24 MED ORDER — AMOXICILLIN-POT CLAVULANATE 875-125 MG PO TABS
1.0000 | ORAL_TABLET | Freq: Once | ORAL | Status: AC
  Administered 2018-08-24: 1 via ORAL
  Filled 2018-08-24: qty 1

## 2018-08-24 MED ORDER — SODIUM CHLORIDE 0.9 % IV BOLUS
500.0000 mL | Freq: Once | INTRAVENOUS | Status: AC
  Administered 2018-08-24: 20:00:00 500 mL via INTRAVENOUS

## 2018-08-24 MED ORDER — IOHEXOL 300 MG/ML  SOLN
100.0000 mL | Freq: Once | INTRAMUSCULAR | Status: AC | PRN
  Administered 2018-08-24: 21:00:00 100 mL via INTRAVENOUS

## 2018-08-24 MED ORDER — AMOXICILLIN-POT CLAVULANATE 875-125 MG PO TABS: 1.0000 | ORAL_TABLET | Freq: Two times a day (BID) | ORAL | 0 refills | Status: AC

## 2018-08-24 MED ORDER — SODIUM CHLORIDE 0.9 % IV SOLN: INTRAVENOUS | Status: DC

## 2018-08-24 NOTE — ED Provider Notes (Signed)
[facility] EMERGENCY DEPARTMENT Provider Note   CSN: [id number] Arrival date & time: 08/24/18  [place]    History   Chief Complaint Chief Complaint  Patient presents with  . Abdominal Pain    right side    HPI Donna Strong is a 58 y.o. female.     HPI   She presents for evaluation of right lower quadrant pain present for 2 days, with increased stooling, worse 2 days ago better today.  The pain radiates to her right lower and right mid back.  No blood in the stool.  She ate well yesterday but today does not feel hungry.  She denies dysuria, urinary frequency, hematuria.  Pain is described as 8/10.  No prior similar problems.  She denies cough, shortness of breath or chest pain.  There are no other known modifying factors.  Past Medical History:  Diagnosis Date  . H/O thoracic outlet syndrome   . Hypothyroidism   . Tumor associated pain    pitutary    Patient Active Problem List   Diagnosis Date Noted  . S/P thyroidectomy 07/11/2013    Past Surgical History:  Procedure Laterality Date  . ABDOMINAL HYSTERECTOMY    . BONE RESECTION, RIB    . COLONOSCOPY N/A 01/28/2013   Procedure: COLONOSCOPY;  Surgeon: Danie Binder, MD;  Location: [facility] ENDO SUITE;  Service: Endoscopy;  Laterality: N/A;  9:30 AM-moved to 8:30 Melanie notified pt  . EAR CYST EXCISION Right 07/02/2012   Procedure: EXCISION OF SEBACEOUS CYST OF SCALP;  Surgeon: Donato Heinz, MD;  Location: [facility] ORS;  Service: General;  Laterality: Right;  . EAR CYST EXCISION Left 12/2011  . THYROID LOBECTOMY Right 07/12/2014   with frozen section     DR ROSEN  . THYROIDECTOMY Right 07/11/2013   Procedure: RIGHT THYROID LOBECTOMY WITH FROZEN SECTION;  Surgeon: Izora Gala, MD;  Location: [place];  Service: ENT;  Laterality: Right;     OB History   No obstetric history on file.      Home Medications    Prior to Admission medications   Medication Sig Start Date End Date Taking? Authorizing Provider  bromocriptine  (PARLODEL) 2.5 MG tablet Take 2.5 mg by mouth daily.    [provider]  HYDROcodone-acetaminophen (NORCO) 7.5-325 MG per tablet Take 1 tablet by mouth every 6 (six) hours as needed for moderate pain. 07/11/13   Izora Gala, MD  levothyroxine (SYNTHROID, LEVOTHROID) 50 MCG tablet Take 50 mcg by mouth daily before breakfast.    [provider]  Multiple Vitamins-Minerals (MULTIVITAMIN WITH MINERALS) tablet Take 1 tablet by mouth daily.    [provider]  promethazine (PHENERGAN) 25 MG suppository Place 1 suppository (25 mg total) rectally every 6 (six) hours as needed for nausea or vomiting. 07/11/13   Izora Gala, MD    Family History Family History  Problem Relation Age of Onset  . Colon cancer Other   . Colon cancer Maternal Uncle     Social History Social History   Tobacco Use  . Smoking status: Never Smoker  . Smokeless tobacco: Never Used  Substance Use Topics  . Alcohol use: No  . Drug use: No     Allergies   Codeine   Review of Systems Review of Systems  All other systems reviewed and are negative.    Physical Exam Updated Vital Signs BP (!) 138/109 (BP Location: Right Arm)   Pulse 99   Temp 99.4 F (37.4 C) (Oral)  Resp 20   Ht 5\' 11"  (1.803 m)   Wt 89.4 kg   SpO2 100%   BMI 27.48 kg/m   Physical Exam Vitals signs and nursing note reviewed.  Constitutional:      General: She is not in acute distress.    Appearance: She is well-developed. She is not ill-appearing, toxic-appearing or diaphoretic.  HENT:     Head: Normocephalic and atraumatic.     Right Ear: External ear normal.     Left Ear: External ear normal.  Eyes:     Conjunctiva/sclera: Conjunctivae normal.     Pupils: Pupils are equal, round, and reactive to light.  Neck:     Musculoskeletal: Normal range of motion and neck supple.     Trachea: Phonation normal.  Cardiovascular:     Rate and Rhythm: Normal rate and regular rhythm.     Heart sounds: Normal  heart sounds.  Pulmonary:     Effort: Pulmonary effort is normal. No respiratory distress.     Breath sounds: Normal breath sounds. No stridor. No rhonchi.  Chest:     Chest wall: No tenderness.  Abdominal:     General: There is no distension.     Palpations: Abdomen is soft. There is no mass.     Tenderness: There is abdominal tenderness (Right lower quadrant, mild). There is no guarding or rebound.     Hernia: No hernia is present.  Genitourinary:    Comments: No costovertebral angle tenderness with percussion. Musculoskeletal: Normal range of motion.        General: No swelling or tenderness.  Skin:    General: Skin is warm and dry.  Neurological:     Mental Status: She is alert and oriented to person, place, and time.     Cranial Nerves: No cranial nerve deficit.     Sensory: No sensory deficit.     Motor: No abnormal muscle tone.     Coordination: Coordination normal.  Psychiatric:        Mood and Affect: Mood normal.        Behavior: Behavior normal.        Thought Content: Thought content normal.        Judgment: Judgment normal.      ED Treatments / Results  Labs (all labs ordered are listed, but only abnormal results are displayed) Labs Reviewed  COMPREHENSIVE METABOLIC PANEL  LIPASE, BLOOD  CBC WITH DIFFERENTIAL/PLATELET  URINALYSIS, ROUTINE W REFLEX MICROSCOPIC  POC URINE PREG, ED    EKG None  Radiology No results found.  Procedures Procedures (including critical care time)  Medications Ordered in ED Medications  sodium chloride 0.9 % bolus 500 mL (has no administration in time range)  0.9 %  sodium chloride infusion (has no administration in time range)     Initial Impression / Assessment and Plan / ED Course  I have reviewed the triage vital signs and the nursing notes.  Pertinent labs & imaging results that were available during my care of the patient were reviewed by me and considered in my medical decision making (see chart for details).          Patient Vitals for the past 24 hrs:  BP Temp Temp src Pulse Resp SpO2 Height Weight  08/24/18 1921 - - - - - - 5\' 11"  (1.803 m) 89.4 kg  08/24/18 1920 (!) 138/109 99.4 F (37.4 C) Oral 99 20 100 % - -    Evaluation ordered to look for urologic, intestinal,  intra-abdominal, or possibly spinal disorders.  Medical Decision Making: Short-term painful condition, primarily abdomen radiating to right lower back, with wide differential.  No distinct symptoms to localize.  He does have increased stooling.  Considering urinary tract infection, colitis, urolithiasis, spinal radiculopathy.  CRITICAL CARE-no Performed by: Daleen Bo  Nursing Notes Reviewed/ Care Coordinated Applicable Imaging Reviewed Interpretation of Laboratory Data incorporated into ED treatment   Plan-disposition by oncoming provider team   Final Clinical Impressions(s) / ED Diagnoses   Final diagnoses:  Right lower quadrant abdominal pain    ED Discharge Orders    None       Daleen Bo, MD 08/24/18 1940

## 2018-08-24 NOTE — ED Triage Notes (Signed)
Patient states abdominal pain on the right side and it has been radiating to the flank area since Wednesday. Patient also states that she had had lose stool.

## 2018-08-24 NOTE — Discharge Instructions (Addendum)
You were evaluated in the Emergency Department and after careful evaluation, we did not find any emergent condition requiring admission or further testing in the hospital.  Your symptoms today seem to be due to diverticulitis.  Please take the antibiotic prescribed as directed.  The most common side effect of this antibiotic is some mild diarrhea.  Your CT scan also revealed a cyst on your kidney.  Our radiologists recommend a follow-up MRI to look at this cyst closer.  This can be scheduled by your primary care doctor.  Please return to the Emergency Department if you experience any worsening of your condition.  We encourage you to follow up with a primary care provider.  Thank you for allowing [place] to be a part of your care.

## 2018-08-24 NOTE — ED Provider Notes (Signed)
Assumed care from Dr. Eulis Foster at shift change.  Work-up reveals diverticulitis.  Prescription for Augmentin.  Patient has normal vital signs, well-appearing, requesting discharge.  No indication for admission or further testing here in the emergency department.  Patient made aware of her renal cyst also found on CT, advised to follow-up with PCP to schedule nonemergent MRI.  After the discussed management above, the patient was determined to be safe for discharge.  The patient was in agreement with this plan and all questions regarding their care were answered.  ED return precautions were discussed and the patient will return to the ED with any significant worsening of condition.   Maudie Flakes, MD 08/24/18 2232

## 2018-10-04 ENCOUNTER — Other Ambulatory Visit: Payer: Self-pay | Admitting: Urology

## 2018-10-04 DIAGNOSIS — N281 Cyst of kidney, acquired: Secondary | ICD-10-CM

## 2018-10-31 DIAGNOSIS — Z8719 Personal history of other diseases of the digestive system: Secondary | ICD-10-CM | POA: Insufficient documentation

## 2018-10-31 DIAGNOSIS — Z86010 Personal history of colonic polyps: Secondary | ICD-10-CM | POA: Insufficient documentation

## 2018-10-31 DIAGNOSIS — Z800 Family history of malignant neoplasm of digestive organs: Secondary | ICD-10-CM | POA: Insufficient documentation

## 2018-10-31 NOTE — Progress Notes (Addendum)
REVIEWED-NO ADDITIONAL RECOMMENDATIONS.  Primary Care Physician:  Monico Blitz, MD Primary Gastroenterologist:  Dr. Oneida Alar  Chief Complaint  Patient presents with  . Abdominal Pain    diagnosed with diverticulitis early this year. has pain that comes/goes    HPI:   Donna Strong is a 58 y.o. female presenting today for follow-up diverticulitis. She was diagnosed on 08/24/18 after presenting to the ED with RLQ abdominal pain and increased stooling. CT abdomen and pelvis with focal wall thickening at distal descending/proximal sigmoid colon with surrounding inflammatory changes consistent with diverticulitis. Labs at that time with WBC 13.0, Hgb 12.3, electrolytes, kidney function, and and LFTs within normal limits, lipase within normal limits. Prescribed Augmentin BID x 10 days. Patient last seen by our staff at time of colonoscopy in 2014.   Today: Patient states she has definitely improved since May, but she continues to have some abdominal pain that has persisted. Initially, pain was a 9-10/10 in the RLQ that extended over to the midline of the abdomen. She completed all 10 days of Augmentin and since has continued to have constant, dull pain in the same area. Occasionally will be sharp. Not worsening since stopping antibiotics, just persisting. Tried decreasing fried foods, but no real improvement. Pain is not affected by movement or bowel habits. BMs daily, back to baseline. Taking fiber supplement. No diarrhea. No constipation or straining. No hematochezia or melena.   No fever, chills, fatigue, lightheadedness, dizziness, or feeling like she will pass out. Appetite is good. No unintentional weight loss. No urinary symptoms. No N/V, heartburn, acid reflux, or dysphagia.   Last colonoscopy 2014 with diverticulosis throughout colon, 2 polyps (1 tubular adenoma and 1 hyperplastic), internal hemorrhoids. Repeat in 10 years.   Family history of colon cancer in maternal uncle and paternal  grandfather both diagnosed over age 55. Paternal aunt with non-cancerous polyps.    Past Medical History:  Diagnosis Date  . H/O thoracic outlet syndrome   . Hypothyroidism   . Tumor associated pain    pitutary    Past Surgical History:  Procedure Laterality Date  . ABDOMINAL HYSTERECTOMY    . BONE RESECTION, RIB    . COLONOSCOPY N/A 01/28/2013   Procedure: COLONOSCOPY;  Surgeon: Danie Binder, MD;  [facility];  Service: Endoscopy;  Laterality: N/A;  9:30 AM-moved to 8:30 Melanie notified pt  . EAR CYST EXCISION Right 07/02/2012   Procedure: EXCISION OF SEBACEOUS CYST OF SCALP;  Surgeon: Donato Heinz, MD;  Location: [facility];  Service: General;  Laterality: Right;  . EAR CYST EXCISION Left 12/2011  . THYROID LOBECTOMY Right 07/12/2014   with frozen section     DR ROSEN  . THYROIDECTOMY Right 07/11/2013   Procedure: RIGHT THYROID LOBECTOMY WITH FROZEN SECTION;  Surgeon: Izora Gala, MD;  Location: [place];  Service: ENT;  Laterality: Right;    Current Outpatient Medications  Medication Sig Dispense Refill  . levothyroxine (SYNTHROID, LEVOTHROID) 50 MCG tablet Take 50 mcg by mouth daily before breakfast.    . Multiple Vitamins-Minerals (MULTIVITAMIN WITH MINERALS) tablet Take 1 tablet by mouth daily.     No current facility-administered medications for this visit.     Allergies as of 11/01/2018 - Review Complete 08/24/2018  Allergen Reaction Noted  . Codeine Nausea And Vomiting 06/26/2012    Family History  Problem Relation Age of Onset  . Colon cancer Other        diagnosed in his 62s  . Colon cancer Maternal  Uncle        diagnosed in 32s  . Colon polyps Paternal Aunt        per patient, non-cancerous    Social History   Socioeconomic History  . Marital status: Married    Spouse name: Not on file  . Number of children: Not on file  . Years of education: Not on file  . Highest education level: Not on file  Occupational History  . Not on file  Social  Needs  . Financial resource strain: Not on file  . Food insecurity    Worry: Not on file    Inability: Not on file  . Transportation needs    Medical: Not on file    Non-medical: Not on file  Tobacco Use  . Smoking status: Never Smoker  . Smokeless tobacco: Never Used  Substance and Sexual Activity  . Alcohol use: No  . Drug use: No  . Sexual activity: Yes    Birth control/protection: Surgical  Lifestyle  . Physical activity    Days per week: Not on file    Minutes per session: Not on file  . Stress: Not on file  Relationships  . Social Herbalist on phone: Not on file    Gets together: Not on file    Attends religious service: Not on file    Active member of club or organization: Not on file    Attends meetings of clubs or organizations: Not on file    Relationship status: Not on file  . Intimate partner violence    Fear of current or ex partner: Not on file    Emotionally abused: Not on file    Physically abused: Not on file    Forced sexual activity: Not on file  Other Topics Concern  . Not on file  Social History Narrative  . Not on file    Review of Systems: Gen: See HPI.  CV: Denies chest pain, heart palpitations, peripheral edema, syncope.  Resp: Denies shortness of breath at rest. Denies wheezing or cough.  GI: See HPI GU : Denies urinary burning, urinary frequency, urinary hesitancy, hematuria.  Derm: Denies rash, itching, dry skin Psych: Denies depression, anxiety Heme: Denies bruising, bleeding  Physical Exam: BP (!) 147/90   Pulse 87   Temp (!) 97 F (36.1 C) (Oral)   Ht 5\' 11"  (1.803 m)   Wt 195 lb 6.4 oz (88.6 kg)   BMI 27.25 kg/m  General:   Alert and oriented. Pleasant and cooperative. Well-nourished and well-developed.  Head:  Normocephalic and atraumatic. Eyes:  Without icterus, sclera clear and conjunctiva pink.  Ears:  Normal auditory acuity. Nose:  No deformity, discharge,  or lesions. Lungs:  Clear to auscultation  bilaterally. No wheezes, rales, or rhonchi. No distress.  Heart:  S1, S2 present without murmurs appreciated.  Abdomen:  +BS, soft, and non-distended. Mild tenderness to palpation in RLQ to middle of lower abdomen. Otherwise without tenderness to palpation.  No HSM noted. No guarding or rebound. No masses appreciated.  Rectal:  Deferred  Msk: Symmetrical without gross deformities. Normal posture. Extremities:  Without clubbing or edema. Neurologic:  Alert and  oriented x4;  grossly normal neurologically. Skin:  Intact without significant lesions or rashes. Psych: Normal mood and affect.

## 2018-11-01 ENCOUNTER — Other Ambulatory Visit: Payer: Self-pay

## 2018-11-01 ENCOUNTER — Ambulatory Visit (INDEPENDENT_AMBULATORY_CARE_PROVIDER_SITE_OTHER): Payer: BC Managed Care – PPO | Admitting: Gastroenterology

## 2018-11-01 ENCOUNTER — Telehealth: Payer: Self-pay | Admitting: *Deleted

## 2018-11-01 ENCOUNTER — Encounter: Payer: Self-pay | Admitting: Gastroenterology

## 2018-11-01 DIAGNOSIS — Z800 Family history of malignant neoplasm of digestive organs: Secondary | ICD-10-CM | POA: Diagnosis not present

## 2018-11-01 DIAGNOSIS — Z20822 Contact with and (suspected) exposure to covid-19: Secondary | ICD-10-CM

## 2018-11-01 DIAGNOSIS — Z8719 Personal history of other diseases of the digestive system: Secondary | ICD-10-CM | POA: Diagnosis not present

## 2018-11-01 DIAGNOSIS — Z86010 Personal history of colonic polyps: Secondary | ICD-10-CM | POA: Diagnosis not present

## 2018-11-01 NOTE — Telephone Encounter (Signed)
Called patient insurance [employer] at [phone]. Spoke with Birdie Y and was advised no PA is required. Ref# [id number].  CT scheduled for 8/10 at 3:30pm, arrival time 3:15pm, npo 4 hrs prior. P/u oral contrast from [facility].  Patient aware. Nothing further needed

## 2018-11-01 NOTE — Assessment & Plan Note (Signed)
Addressed under history of diverticulitis

## 2018-11-01 NOTE — Patient Instructions (Signed)
We will go ahead and get another CT scan of your abdomen to ensure you do not have any ongoing infection. I have placed the orders. Someone should call you to set up a date and time. It should be within the next week. Further recommendations to follow.   You will need a colonoscopy in the near future, but we will wait to schedule until after we get the CT results.   Aliene Altes, PA-C [facility]

## 2018-11-01 NOTE — Progress Notes (Signed)
cc'ed to pcp °

## 2018-11-01 NOTE — Assessment & Plan Note (Addendum)
58 y.o. female with history of diverticulitis of distal descending/proximal sigmoid colon diagnosed on 08/24/18 via CT without complication. Patient was treated with 10 days of Augmentin. After completing antibiotics, patient has improved, but continues with constant, dull 3-4/10 RLQ abdominal pain that extends to the midline, which is the same location her pain was initially. Not worsening. No urinary symptoms. No fever or chills. BMs back to baseline. No hematochezia or melena. Last TCS in 2014 with diverticulosis throughout colon, 2 polyps (1 tubular adenoma and 1 hyperplastic), internal hemorrhoids. Recommended repeat in 10 years. Of note patient does have family history of colon cancer in paternal grandfather and maternal uncle, both diagnosed over age 9. Paternal aunt with colon polyps.   At this time, will repeat CT abdomen and pelvis with IV contrast to rule out persistent smoldering diverticulitis or development of any fluid collections etc. Further recommendations to follow.  Patient will need colonoscopy with Dr. Oneida Alar. Once CT results, will be able to determine timing.

## 2018-11-02 LAB — NOVEL CORONAVIRUS, NAA: SARS-CoV-2, NAA: NOT DETECTED

## 2018-11-05 ENCOUNTER — Other Ambulatory Visit: Payer: Self-pay

## 2018-11-05 ENCOUNTER — Ambulatory Visit (HOSPITAL_COMMUNITY)
Admission: RE | Admit: 2018-11-05 | Discharge: 2018-11-05 | Disposition: A | Discharge status: Home / Self Care | Payer: BC Managed Care – PPO | Source: Ambulatory Visit | Attending: Gastroenterology | Admitting: Gastroenterology

## 2018-11-05 DIAGNOSIS — Z8719 Personal history of other diseases of the digestive system: Secondary | ICD-10-CM | POA: Insufficient documentation

## 2018-11-05 MED ORDER — IOHEXOL 300 MG/ML  SOLN
100.0000 mL | Freq: Once | INTRAMUSCULAR | Status: AC | PRN
  Administered 2018-11-05: 100 mL via INTRAVENOUS

## 2018-11-06 ENCOUNTER — Telehealth: Payer: Self-pay | Admitting: Gastroenterology

## 2018-11-06 NOTE — Telephone Encounter (Signed)
Dr. Oneida Alar does not have any openings in 4-6 weeks. Patient name placed on cancellation list if someone cancels. otherwise we will call to schedule once something opens up on schedule

## 2018-11-06 NOTE — Progress Notes (Signed)
CT without evidence of ongoing diverticulitis. Will get TCS scheduled with Dr. Oneida Alar.

## 2018-11-06 NOTE — Telephone Encounter (Signed)
Pt was returning a call from [place]. [phone]

## 2018-11-06 NOTE — Telephone Encounter (Signed)
Pt is aware of results and aware we are trying to get her in in 4-6 weeks with Dr. Oneida Alar.

## 2018-11-06 NOTE — Telephone Encounter (Signed)
See separate note. Pt aware of CT results and plan.

## 2018-11-06 NOTE — Telephone Encounter (Signed)
lmovm

## 2018-11-06 NOTE — Telephone Encounter (Signed)
LMOM to call.

## 2018-11-06 NOTE — Telephone Encounter (Signed)
Doris or [facility] clinical pool, can we let patient know CT is without evidence of ongoing diverticulitis.  [facility] clinical pool, can we get patient scheduled for TCS with Dr. Oneida Alar in 4-6 weeks? History of diverticulitis.

## 2018-11-07 ENCOUNTER — Other Ambulatory Visit: Payer: Self-pay | Admitting: *Deleted

## 2018-11-07 DIAGNOSIS — Z8719 Personal history of other diseases of the digestive system: Secondary | ICD-10-CM

## 2018-11-07 MED ORDER — PEG 3350-KCL-NA BICARB-NACL 420 G PO SOLR: 4000.0000 mL | Freq: Once | ORAL | 0 refills | Status: AC

## 2018-11-07 NOTE — Telephone Encounter (Signed)
LMOVM

## 2018-11-07 NOTE — Telephone Encounter (Addendum)
Spoke with patient. She is scheduled for TCS with [facility] 9/11 at 2:00pm. Patient will need to go for COVID-19 testing. She is scheduled for 9/10 at 10:30am. Patient aware will mail instructions (confirmed address). Rx sent to [facility].   Checked patient insurance via [employer] and received message "This service does not require an authorization for this member"

## 2018-11-14 ENCOUNTER — Ambulatory Visit
Admission: RE | Admit: 2018-11-14 | Discharge: 2018-11-14 | Disposition: A | Discharge status: Home / Self Care | Payer: BLUE CROSS/BLUE SHIELD | Source: Ambulatory Visit | Attending: Urology | Admitting: Urology

## 2018-11-14 ENCOUNTER — Other Ambulatory Visit: Payer: Self-pay

## 2018-11-14 DIAGNOSIS — N281 Cyst of kidney, acquired: Secondary | ICD-10-CM

## 2018-11-14 MED ORDER — GADOBENATE DIMEGLUMINE 529 MG/ML IV SOLN
18.0000 mL | Freq: Once | INTRAVENOUS | Status: AC | PRN
  Administered 2018-11-14: 12:00:00 18 mL via INTRAVENOUS

## 2018-12-06 ENCOUNTER — Telehealth: Payer: Self-pay | Admitting: *Deleted

## 2018-12-06 ENCOUNTER — Other Ambulatory Visit (HOSPITAL_COMMUNITY)
Admission: RE | Admit: 2018-12-06 | Discharge: 2018-12-06 | Disposition: A | Discharge status: Home / Self Care | Payer: BC Managed Care – PPO | Source: Ambulatory Visit | Attending: Gastroenterology | Admitting: Gastroenterology

## 2018-12-06 DIAGNOSIS — Z01812 Encounter for preprocedural laboratory examination: Secondary | ICD-10-CM | POA: Diagnosis not present

## 2018-12-06 DIAGNOSIS — Z20828 Contact with and (suspected) exposure to other viral communicable diseases: Secondary | ICD-10-CM | POA: Diagnosis not present

## 2018-12-06 LAB — SARS CORONAVIRUS 2 (TAT 6-24 HRS): SARS Coronavirus 2: NEGATIVE

## 2018-12-06 NOTE — Telephone Encounter (Signed)
Called patient. Offered to move procedure up tomorrow to 9:30am. Patient was agreeable. Discussed new prep instruction times with her. She also is coming by to pick these up. Called endo and LMOVM to move up

## 2018-12-07 ENCOUNTER — Encounter (HOSPITAL_COMMUNITY): Payer: Self-pay | Admitting: *Deleted

## 2018-12-07 ENCOUNTER — Ambulatory Visit (HOSPITAL_COMMUNITY)
Admission: RE | Admit: 2018-12-07 | Discharge: 2018-12-07 | Disposition: A | Discharge status: Home / Self Care | Payer: BC Managed Care – PPO | Attending: Gastroenterology | Admitting: Gastroenterology

## 2018-12-07 ENCOUNTER — Encounter (HOSPITAL_COMMUNITY)
Admission: RE | Disposition: A | Discharge status: Home / Self Care | Payer: Self-pay | Source: Home / Self Care | Attending: Gastroenterology

## 2018-12-07 ENCOUNTER — Other Ambulatory Visit: Payer: Self-pay

## 2018-12-07 DIAGNOSIS — E890 Postprocedural hypothyroidism: Secondary | ICD-10-CM | POA: Diagnosis not present

## 2018-12-07 DIAGNOSIS — Q439 Congenital malformation of intestine, unspecified: Secondary | ICD-10-CM | POA: Diagnosis not present

## 2018-12-07 DIAGNOSIS — K5730 Diverticulosis of large intestine without perforation or abscess without bleeding: Secondary | ICD-10-CM | POA: Insufficient documentation

## 2018-12-07 DIAGNOSIS — R933 Abnormal findings on diagnostic imaging of other parts of digestive tract: Secondary | ICD-10-CM | POA: Insufficient documentation

## 2018-12-07 DIAGNOSIS — K648 Other hemorrhoids: Secondary | ICD-10-CM | POA: Insufficient documentation

## 2018-12-07 DIAGNOSIS — K635 Polyp of colon: Secondary | ICD-10-CM | POA: Insufficient documentation

## 2018-12-07 DIAGNOSIS — Z79890 Hormone replacement therapy (postmenopausal): Secondary | ICD-10-CM | POA: Insufficient documentation

## 2018-12-07 DIAGNOSIS — K644 Residual hemorrhoidal skin tags: Secondary | ICD-10-CM | POA: Diagnosis not present

## 2018-12-07 DIAGNOSIS — Z8719 Personal history of other diseases of the digestive system: Secondary | ICD-10-CM

## 2018-12-07 HISTORY — PX: POLYPECTOMY: SHX5525

## 2018-12-07 HISTORY — PX: COLONOSCOPY: SHX5424

## 2018-12-07 SURGERY — COLONOSCOPY
Anesthesia: Moderate Sedation

## 2018-12-07 MED ORDER — STERILE WATER FOR IRRIGATION IR SOLN
Status: DC | PRN
  Administered 2018-12-07: 1.5 mL

## 2018-12-07 MED ORDER — MEPERIDINE HCL 100 MG/ML IJ SOLN
INTRAMUSCULAR | Status: DC | PRN
  Administered 2018-12-07 (×2): 25 mg via INTRAVENOUS

## 2018-12-07 MED ORDER — MIDAZOLAM HCL 5 MG/5ML IJ SOLN
INTRAMUSCULAR | Status: DC | PRN
  Administered 2018-12-07: 2 mg via INTRAVENOUS
  Administered 2018-12-07: 1 mg via INTRAVENOUS
  Administered 2018-12-07: 2 mg via INTRAVENOUS

## 2018-12-07 MED ORDER — MIDAZOLAM HCL 5 MG/5ML IJ SOLN
INTRAMUSCULAR | Status: AC
  Filled 2018-12-07: qty 10

## 2018-12-07 MED ORDER — MEPERIDINE HCL 100 MG/ML IJ SOLN
INTRAMUSCULAR | Status: AC
  Filled 2018-12-07: qty 2

## 2018-12-07 MED ORDER — SODIUM CHLORIDE 0.9 % IV SOLN
INTRAVENOUS | Status: DC
  Administered 2018-12-07: 09:00:00 via INTRAVENOUS

## 2018-12-07 NOTE — H&P (Addendum)
Primary Care Physician:  Monico Blitz, MD Primary Gastroenterologist:  Dr. Oneida Alar  Pre-Procedure History & Physical: HPI:  Donna Strong is a 58 y.o. female here for abnormal imaging:CT May 2020  Past Medical History:  Diagnosis Date  . H/O thoracic outlet syndrome   . Hypothyroidism   . Tumor associated pain    pitutary    Past Surgical History:  Procedure Laterality Date  . ABDOMINAL HYSTERECTOMY    . BONE RESECTION, RIB    . COLONOSCOPY N/A 01/28/2013   Procedure: COLONOSCOPY;  Surgeon: Danie Binder, MD;  [facility];  Service: Endoscopy;  Laterality: N/A;  9:30 AM-moved to 8:30 Melanie notified pt  . EAR CYST EXCISION Right 07/02/2012   Procedure: EXCISION OF SEBACEOUS CYST OF SCALP;  Surgeon: Donato Heinz, MD;  Location: [facility];  Service: General;  Laterality: Right;  . EAR CYST EXCISION Left 12/2011  . THYROID LOBECTOMY Right 07/12/2014   with frozen section     DR ROSEN  . THYROIDECTOMY Right 07/11/2013   Procedure: RIGHT THYROID LOBECTOMY WITH FROZEN SECTION;  Surgeon: Izora Gala, MD;  Location: [place];  Service: ENT;  Laterality: Right;    Prior to Admission medications   Medication Sig Start Date End Date Taking? Authorizing Provider  GAVILYTE-N WITH FLAVOR PACK 420 g solution Take 4,000 mLs by mouth once. 11/07/18  Yes [provider]  levothyroxine (SYNTHROID, LEVOTHROID) 50 MCG tablet Take 50 mcg by mouth daily before breakfast.   Yes [provider]  Multiple Vitamins-Minerals (MULTIVITAMIN WITH MINERALS) tablet Take 1 tablet by mouth daily.   Yes [provider]    Allergies as of 11/07/2018 - Review Complete 11/01/2018  Allergen Reaction Noted  . Codeine Nausea And Vomiting 06/26/2012    Family History  Problem Relation Age of Onset  . Colon cancer Other        diagnosed in his 43s  . Colon cancer Maternal Uncle        diagnosed in 19s  . Colon polyps Paternal Aunt        per patient, non-cancerous     Social History   Socioeconomic History  . Marital status: Married    Spouse name: Not on file  . Number of children: Not on file  . Years of education: Not on file  . Highest education level: Not on file  Occupational History  . Not on file  Social Needs  . Financial resource strain: Not on file  . Food insecurity    Worry: Not on file    Inability: Not on file  . Transportation needs    Medical: Not on file    Non-medical: Not on file  Tobacco Use  . Smoking status: Never Smoker  . Smokeless tobacco: Never Used  Substance and Sexual Activity  . Alcohol use: No  . Drug use: No  . Sexual activity: Yes    Birth control/protection: Surgical  Lifestyle  . Physical activity    Days per week: Not on file    Minutes per session: Not on file  . Stress: Not on file  Relationships  . Social Herbalist on phone: Not on file    Gets together: Not on file    Attends religious service: Not on file    Active member of club or organization: Not on file    Attends meetings of clubs or organizations: Not on file    Relationship status: Not on file  .  Intimate partner violence    Fear of current or ex partner: Not on file    Emotionally abused: Not on file    Physically abused: Not on file    Forced sexual activity: Not on file  Other Topics Concern  . Not on file  Social History Narrative  . Not on file    Review of Systems: See HPI, otherwise negative ROS   Physical Exam: BP (!) 149/86   Pulse 79   Temp 98.2 F (36.8 C) (Oral)   Resp 10   Ht 5\' 11"  (1.803 m)   Wt 87.5 kg   SpO2 100%   BMI 26.92 kg/m  General:   Alert,  pleasant and cooperative in NAD Head:  Normocephalic and atraumatic. Neck:  Supple; Lungs:  Clear throughout to auscultation.    Heart:  Regular rate and rhythm. Abdomen:  Soft, nontender and nondistended. Normal bowel sounds, without guarding, and without rebound.   Neurologic:  Alert and  oriented x4;  grossly normal  neurologically.  Impression/Plan:    ABNL CT MAY 2020  Plan:  1. TCS TODAY DISCUSSED PROCEDURE, BENEFITS, & RISKS: < 1% chance of medication reaction, bleeding, perforation, ASPIRATION, or rupture of spleen/liver requiring surgery to fix it and missed polyps < 1 cm 10-20% of the time.

## 2018-12-07 NOTE — Op Note (Signed)
[facility] Patient Name: Donna Strong Procedure Date: 12/07/2018 9:05 AM MRN: [id number] Date of Birth: 02/06/1961 Attending MD: Barney Drain MD, MD CSN: [id number] Age: 58 Admit Type: Outpatient Procedure:                Colonoscopy WITH COLD SNARE POLYPECTOMY Indications:              Abnormal CT of the GI tract Providers:                Barney Drain MD, MD, Charlsie Quest. Theda Sers RN, RN,                            Aram Candela Referring MD:             Fuller Canada Manuella Ghazi MD, MD Medicines:                Meperidine 50 mg IV, Midazolam 5 mg IV Complications:            No immediate complications. Estimated Blood Loss:     Estimated blood loss was minimal. Procedure:                Pre-Anesthesia Assessment:                           - Prior to the procedure, a History and Physical                            was performed, and patient medications and                            allergies were reviewed. The patient's tolerance of                            previous anesthesia was also reviewed. The risks                            and benefits of the procedure and the sedation                            options and risks were discussed with the patient.                            All questions were answered, and informed consent                            was obtained. Prior Anticoagulants: The patient has                            taken no previous anticoagulant or antiplatelet                            agents. ASA Grade Assessment: II - A patient with                            mild systemic disease. After reviewing the risks  and benefits, the patient was deemed in                            satisfactory condition to undergo the procedure.                            After obtaining informed consent, the colonoscope                            was passed under direct vision. Throughout the                            procedure, the patient's blood pressure,  pulse, and                            oxygen saturations were monitored continuously. The                            [id number]-[id number]) scope was introduced through                            the anus and advanced to the the cecum, identified                            by appendiceal orifice and ileocecal valve. The                            colonoscopy was somewhat difficult due to a                            tortuous colon. Successful completion of the                            procedure was aided by straightening and shortening                            the scope to obtain bowel loop reduction and                            COLOWRAP. The patient tolerated the procedure well.                            The quality of the bowel preparation was good. The                            ileocecal valve, appendiceal orifice, and rectum                            were photographed. Scope In: 9:51:50 AM Scope Out: 10:11:04 AM Scope Withdrawal Time: 0 hours 14 minutes 53 seconds  Total Procedure Duration: 0 hours 19 minutes 14 seconds  Findings:      Multiple small and large-mouthed diverticula were found in the entire       colon.  Two sessile polyps were found in the sigmoid colon. The polyps were 2 to       4 mm in size. These polyps were removed with a cold snare. Resection and       retrieval were complete.      External and internal hemorrhoids were found.      The recto-sigmoid colon, sigmoid colon and descending colon were       moderately tortuous. Impression:               - Diverticulosis in the entire examined colon.                           - Two 2 to 4 mm polyps in the sigmoid colon,                            removed with a cold snare. Resected and retrieved.                           - External and internal hemorrhoids.                           - Tortuous colon.                           - CT FINDINGS CONSISTENT WITH DIVERTICULITIS AND                            NOW  HAVE RESOLVED Moderate Sedation:      Moderate (conscious) sedation was administered by the endoscopy nurse       and supervised by the endoscopist. The following parameters were       monitored: oxygen saturation, heart rate, blood pressure, and response       to care. Total physician intraservice time was 38 minutes. Recommendation:           - Patient has a contact number available for                            emergencies. The signs and symptoms of potential                            delayed complications were discussed with the                            patient. Return to normal activities tomorrow.                            Written discharge instructions were provided to the                            patient.                           - High fiber diet.                           - Continue present medications.                           -  Await pathology results.                           - Repeat colonoscopy in 5-10 years for surveillance. Procedure Code(s):        --- Professional ---                           [id number], Colonoscopy, flexible; with removal of                            tumor(s), polyp(s), or other lesion(s) by snare                            technique                           99153, Moderate sedation; each additional 15                            minutes intraservice time                           99153, Moderate sedation; each additional 15                            minutes intraservice time                           G0500, Moderate sedation services provided by the                            same physician or other qualified health care                            professional performing a gastrointestinal                            endoscopic service that sedation supports,                            requiring the presence of an independent trained                            observer to assist in the monitoring of the                            patient's  level of consciousness and physiological                            status; initial 15 minutes of intra-service time;                            patient age 45 years or older (additional time may                            be reported with  99153, as appropriate) Diagnosis Code(s):        --- Professional ---                           K64.8, Other hemorrhoids                           K63.5, Polyp of colon                           K57.30, Diverticulosis of large intestine without                            perforation or abscess without bleeding                           R93.3, Abnormal findings on diagnostic imaging of                            other parts of digestive tract                           Q43.8, Other specified congenital malformations of                            intestine CPT copyright 2019 [employer] Association. All rights reserved. The codes documented in this report are preliminary and upon coder review may  be revised to meet current compliance requirements. Barney Drain, MD Barney Drain MD, MD 12/07/2018 10:43:19 AM This report has been signed electronically. Number of Addenda: 0

## 2018-12-07 NOTE — Discharge Instructions (Signed)
You have small internal hemorrhoids and diverticulosis IN YOUR LEFT AND RIGHT COLON. YOU HAD TWO SMALL POLYPS REMOVED.    To reduce risk of diverticulitis, minimize use of aspirin products and NSIADs. USE ASPIRIN, BC, GOODYS, IBUPROFEN/MORTIN, OR NAPROXEN/ALEVE.  DRINK WATER TO KEEP YOUR URINE LIGHT YELLOW.  FOLLOW A HIGH FIBER DIET. AVOID ITEMS THAT CAUSE BLOATING. See info below.   USE PREPARATION H FOUR TIMES  A DAY IF NEEDED TO RELIEVE RECTAL PAIN/PRESSURE/BLEEDING.   YOUR BIOPSY RESULTS WILL BE BACK IN 5 BUSINESS DAYS.  Next colonoscopy in 5-10 years.  Colonoscopy Care After Read the instructions outlined below and refer to this sheet in the next week. These discharge instructions provide you with general information on caring for yourself after you leave the hospital. While your treatment has been planned according to the most current medical practices available, unavoidable complications occasionally occur. If you have any problems or questions after discharge, call DR. Jailyn Langhorst, [phone].  ACTIVITY  You may resume your regular activity, but move at a slower pace for the next 24 hours.   Take frequent rest periods for the next 24 hours.   Walking will help get rid of the air and reduce the bloated feeling in your belly (abdomen).   No driving for 24 hours (because of the medicine (anesthesia) used during the test).   You may shower.   Do not sign any important legal documents or operate any machinery for 24 hours (because of the anesthesia used during the test).    NUTRITION  Drink plenty of fluids.   You may resume your normal diet as instructed by your doctor.   Begin with a light meal and progress to your normal diet. Heavy or fried foods are harder to digest and may make you feel sick to your stomach (nauseated).   Avoid alcoholic beverages for 24 hours or as instructed.    MEDICATIONS  You may resume your normal medications.   WHAT YOU CAN EXPECT  TODAY  Some feelings of bloating in the abdomen.   Passage of more gas than usual.   Spotting of blood in your stool or on the toilet paper  .  IF YOU HAD POLYPS REMOVED DURING THE COLONOSCOPY:  Eat a soft diet IF YOU HAVE NAUSEA, BLOATING, ABDOMINAL PAIN, OR VOMITING.    FINDING OUT THE RESULTS OF YOUR TEST Not all test results are available during your visit. DR. Oneida Alar WILL CALL YOU WITHIN 14 DAYS OF YOUR PROCEDUE WITH YOUR RESULTS. Do not assume everything is normal if you have not heard from DR. Shanautica Forker, CALL HER OFFICE AT [phone].  SEEK IMMEDIATE MEDICAL ATTENTION AND CALL THE OFFICE: [phone] IF:  You have more than a spotting of blood in your stool.   Your belly is swollen (abdominal distention).   You are nauseated or vomiting.   You have a temperature over 101F.   You have abdominal pain or discomfort that is severe or gets worse throughout the day.   High-Fiber Diet A high-fiber diet changes your normal diet to include more whole grains, legumes, fruits, and vegetables. Changes in the diet involve replacing refined carbohydrates with unrefined foods. The calorie level of the diet is essentially unchanged. The Dietary Reference Intake (recommended amount) for adult males is 38 grams per day. For adult females, it is 25 grams per day. Pregnant and lactating women should consume 28 grams of fiber per day. Fiber is the intact part of a plant that is not broken down during  digestion. Functional fiber is fiber that has been isolated from the plant to provide a beneficial effect in the body.  PURPOSE  Increase stool bulk.   Ease and regulate bowel movements.   Lower cholesterol.   REDUCE RISK OF COLON CANCER  INDICATIONS THAT YOU NEED MORE FIBER  Constipation and hemorrhoids.   Uncomplicated diverticulosis (intestine condition) and irritable bowel syndrome.   Weight management.   As a protective measure against hardening of the arteries  (atherosclerosis), diabetes, and cancer.   GUIDELINES FOR INCREASING FIBER IN THE DIET  Start adding fiber to the diet slowly. A gradual increase of about 5 more grams (2 servings of most fruits or vegetables) per day is best. Too rapid an increase in fiber may result in constipation, flatulence, and bloating.   Drink enough water and fluids to keep your urine clear or pale yellow. Water, juice, or caffeine-free drinks are recommended. Not drinking enough fluid may cause constipation.   Eat a variety of high-fiber foods rather than one type of fiber.   Try to increase your intake of fiber through using high-fiber foods rather than fiber pills or supplements that contain small amounts of fiber.   The goal is to change the types of food eaten. Do not supplement your present diet with high-fiber foods, but replace foods in your present diet.    Polyps, Colon  A polyp is extra tissue that grows inside your body. Colon polyps grow in the large intestine. The large intestine, also called the colon, is part of your digestive system. It is a long, hollow tube at the end of your digestive tract where your body makes and stores stool. Most polyps are not dangerous. They are benign. This means they are not cancerous. But over time, some types of polyps can turn into cancer. Polyps that are smaller than a pea are usually not harmful. But larger polyps could someday become or may already be cancerous. To be safe, doctors remove all polyps and test them.   PREVENTION There is not one sure way to prevent polyps. You might be able to lower your risk of getting them if you:  Eat more fruits and vegetables and less fatty food.   Do not smoke.   Avoid alcohol.   Exercise every day.   Lose weight if you are overweight.   Eating more calcium and folate can also lower your risk of getting polyps. Some foods that are rich in calcium are milk, cheese, and broccoli. Some foods that are rich in folate are  chickpeas, kidney beans, and spinach.    Diverticulosis Diverticulosis is a common condition that develops when small pouches (diverticula) form in the wall of the colon. The risk of diverticulosis increases with age. It happens more often in people who eat a low-fiber diet. Most individuals with diverticulosis have no symptoms. Those individuals with symptoms usually experience belly (abdominal) pain, constipation, or loose stools (diarrhea).  HOME CARE INSTRUCTIONS  Increase the amount of fiber in your diet as directed by your caregiver or dietician. This may reduce symptoms of diverticulosis.   Drink at least 6 to 8 glasses of water each day to prevent constipation.   Try not to strain when you have a bowel movement.   Avoiding nuts and seeds to prevent complications is NOT NECESSARY.   FOODS HAVING HIGH FIBER CONTENT INCLUDE:  Fruits. Apple, peach, pear, tangerine, raisins, prunes.   Vegetables. Brussels sprouts, asparagus, broccoli, cabbage, carrot, cauliflower, romaine lettuce, spinach, summer squash, tomato,  winter squash, zucchini.   Starchy Vegetables. Baked beans, kidney beans, lima beans, split peas, lentils, potatoes (with skin).    SEEK IMMEDIATE MEDICAL CARE IF:  You develop increasing pain or severe bloating.   You have an oral temperature above 101F.   You develop vomiting or bowel movements that are bloody or black.

## 2018-12-11 ENCOUNTER — Encounter (HOSPITAL_COMMUNITY): Payer: Self-pay | Admitting: Gastroenterology

## 2019-02-13 ENCOUNTER — Encounter: Payer: Self-pay | Admitting: Podiatry

## 2019-02-13 ENCOUNTER — Ambulatory Visit (INDEPENDENT_AMBULATORY_CARE_PROVIDER_SITE_OTHER): Payer: BC Managed Care – PPO | Admitting: Podiatry

## 2019-02-13 ENCOUNTER — Telehealth: Payer: Self-pay | Admitting: Podiatry

## 2019-02-13 ENCOUNTER — Ambulatory Visit (INDEPENDENT_AMBULATORY_CARE_PROVIDER_SITE_OTHER): Payer: BC Managed Care – PPO

## 2019-02-13 ENCOUNTER — Other Ambulatory Visit: Payer: Self-pay

## 2019-02-13 ENCOUNTER — Other Ambulatory Visit: Payer: Self-pay | Admitting: Podiatry

## 2019-02-13 DIAGNOSIS — I10 Essential (primary) hypertension: Secondary | ICD-10-CM | POA: Insufficient documentation

## 2019-02-13 DIAGNOSIS — M79671 Pain in right foot: Secondary | ICD-10-CM | POA: Diagnosis not present

## 2019-02-13 DIAGNOSIS — M216X9 Other acquired deformities of unspecified foot: Secondary | ICD-10-CM

## 2019-02-13 DIAGNOSIS — M2042 Other hammer toe(s) (acquired), left foot: Secondary | ICD-10-CM

## 2019-02-13 DIAGNOSIS — M2041 Other hammer toe(s) (acquired), right foot: Secondary | ICD-10-CM

## 2019-02-13 DIAGNOSIS — E079 Disorder of thyroid, unspecified: Secondary | ICD-10-CM | POA: Insufficient documentation

## 2019-02-13 DIAGNOSIS — J45909 Unspecified asthma, uncomplicated: Secondary | ICD-10-CM | POA: Insufficient documentation

## 2019-02-13 DIAGNOSIS — D497 Neoplasm of unspecified behavior of endocrine glands and other parts of nervous system: Secondary | ICD-10-CM | POA: Insufficient documentation

## 2019-02-13 NOTE — Patient Instructions (Signed)
Pre-Operative Instructions  Congratulations, you have decided to take an important step towards improving your quality of life.  You can be assured that the doctors and staff at [facility] will be with you every step of the way.  Here are some important things you should know:  1. Plan to be at the surgery center/hospital at least 1 (one) hour prior to your scheduled time, unless otherwise directed by the surgical center/hospital staff.  You must have a responsible adult accompany you, remain during the surgery and drive you home.  Make sure you have directions to the surgical center/hospital to ensure you arrive on time. 2. If you are having surgery at [facility] or [facility], you will need a copy of your medical history and physical form from your family physician within one month prior to the date of surgery. We will give you a form for your primary physician to complete.  3. We make every effort to accommodate the date you request for surgery.  However, there are times where surgery dates or times have to be moved.  We will contact you as soon as possible if a change in schedule is required.   4. No aspirin/ibuprofen for one week before surgery.  If you are on aspirin, any non-steroidal anti-inflammatory medications (Mobic, Aleve, Ibuprofen) should not be taken seven (7) days prior to your surgery.  You make take Tylenol for pain prior to surgery.  5. Medications - If you are taking daily heart and blood pressure medications, seizure, reflux, allergy, asthma, anxiety, pain or diabetes medications, make sure you notify the surgery center/hospital before the day of surgery so they can tell you which medications you should take or avoid the day of surgery. 6. No food or drink after midnight the night before surgery unless directed otherwise by surgical center/hospital staff. 7. No alcoholic beverages 24-hours prior to surgery.  No smoking 24-hours prior or 24-hours after  surgery. 8. Wear loose pants or shorts. They should be loose enough to fit over bandages, boots, and casts. 9. Don't wear slip-on shoes. Sneakers are preferred. 10. Bring your boot with you to the surgery center/hospital.  Also bring crutches or a walker if your physician has prescribed it for you.  If you do not have this equipment, it will be provided for you after surgery. 11. If you have not been contacted by the surgery center/hospital by the day before your surgery, call to confirm the date and time of your surgery. 12. Leave-time from work may vary depending on the type of surgery you have.  Appropriate arrangements should be made prior to surgery with your employer. 13. Prescriptions will be provided immediately following surgery by your doctor.  Fill these as soon as possible after surgery and take the medication as directed. Pain medications will not be refilled on weekends and must be approved by the doctor. 14. Remove nail polish on the operative foot and avoid getting pedicures prior to surgery. 15. Wash the night before surgery.  The night before surgery wash the foot and leg well with water and the antibacterial soap provided. Be sure to pay special attention to beneath the toenails and in between the toes.  Wash for at least three (3) minutes. Rinse thoroughly with water and dry well with a towel.  Perform this wash unless told not to do so by your physician.  Enclosed: 1 Ice pack (please put in freezer the night before surgery)   1 Hibiclens skin cleaner     Pre-op instructions  If you have any questions regarding the instructions, please do not hesitate to call our office.  [place]: [address], , [place] [address] -- [phone]  [place]: [address] [place], [place] [address] -- [phone]  [place]: [address]  [place],  [address] -- [phone]   Website: [email or web address] 

## 2019-02-13 NOTE — Telephone Encounter (Signed)
DOS: 03/05/2019  SURGICAL PROCEDURES: Hammertoe Repair with Removal Bone 5th Toe Bilateral(28285) and Elevating (Metatarsal) Osteotomy with Screw 5th [id number])  BCBS Policy Effective : [id number]  -  03/29/2019  Member Liability Summary       In-Network   Max Per Benefit Period Year-to-Date Remaining     CoInsurance         Deductible $1,000.00 $0.00     Out-Of-Pocket 3 $3,000.00 $0.00 3 Out-of-Pocket includes copay, deductible, and coinsurance.  Hospital - [facility]      In-Network Copay Coinsurance Authorization Required Not Applicable [id number]  No Provider: SEE PROVIDER SPECIALTY Contact Name:  [facility]

## 2019-02-13 NOTE — Progress Notes (Signed)
Subjective:   Patient ID: Donna Strong, female   DOB: 58 y.o.   MRN: [id number]   HPI Patient states she has had chronic pain in her feet and she is here to discuss surgery that she should have done 2 years ago and has just been struggling with.  Patient said no change in health history and is found to have exquisite discomfort fifth digit bilateral and chronic painful lesion fifth metatarsal left neuro   ROS      Objective:  Physical Exam  Vascular status intact negative Homans' sign noted good digital perfusion noted with keratotic lesion that is painful digit 5 both feet and plantar lesion fifth metatarsal left that is painful when palpated     Assessment:  Chronic hammertoe deformity fifth digit bilateral with plantar lesion fifth left that is painful     Plan:  H&P x-rays reviewed discussed condition explaining treatment options.  At this point patient wants surgical intervention and I have recommended elevating osteotomy along with arthroplasty fifth digit both feet.  I explained procedure risk and patient wants surgery understanding risk and today I went ahead and I allowed her to read consent form going over alternative treatments complications.  She is willing to accept this and after review signed consent form understanding surgery and is scheduled for outpatient surgery.  Dispensed air fracture walker for the left that I want her to get used to it fine shoe gear right that will fitted appropriately and she is encouraged to call with questions and does understand the absolute total recovery from this procedure can take 6 months to 1 year  X-rays indicate that there is rotation fifth digit bilateral with slight enlargement head of proximal phalanx digit 5 bilateral

## 2019-02-14 ENCOUNTER — Ambulatory Visit: Payer: BLUE CROSS/BLUE SHIELD | Admitting: Podiatry

## 2019-03-05 ENCOUNTER — Telehealth: Payer: Self-pay | Admitting: Podiatry

## 2019-03-05 ENCOUNTER — Encounter: Payer: Self-pay | Admitting: Podiatry

## 2019-03-05 DIAGNOSIS — M2012 Hallux valgus (acquired), left foot: Secondary | ICD-10-CM

## 2019-03-05 DIAGNOSIS — M2041 Other hammer toe(s) (acquired), right foot: Secondary | ICD-10-CM | POA: Diagnosis not present

## 2019-03-05 DIAGNOSIS — M2042 Other hammer toe(s) (acquired), left foot: Secondary | ICD-10-CM

## 2019-03-05 MED ORDER — PROMETHAZINE HCL 25 MG PO TABS: ORAL_TABLET | ORAL | 0 refills | Status: DC

## 2019-03-05 NOTE — Telephone Encounter (Signed)
I informed pt the phenergan had been sent to the [facility].

## 2019-03-05 NOTE — Addendum Note (Signed)
Addended by: Har[place] Sine D on: 03/05/2019 11:49 AM   Modules accepted: Orders

## 2019-03-05 NOTE — Telephone Encounter (Signed)
Dr. Paulla Dolly ordered Phenergan 25mg  #30 one tablet every 4-6 hours prn nausea.

## 2019-03-05 NOTE — Telephone Encounter (Signed)
Pt had surgery today and was given a prescription for pain medication but is needing a prescription for Promethazine to take along with the pain medicine. Please send prescription to [facility] on [address]

## 2019-03-11 ENCOUNTER — Ambulatory Visit (INDEPENDENT_AMBULATORY_CARE_PROVIDER_SITE_OTHER): Payer: BC Managed Care – PPO | Admitting: Podiatry

## 2019-03-11 ENCOUNTER — Ambulatory Visit (INDEPENDENT_AMBULATORY_CARE_PROVIDER_SITE_OTHER): Payer: BC Managed Care – PPO

## 2019-03-11 ENCOUNTER — Other Ambulatory Visit: Payer: Self-pay

## 2019-03-11 ENCOUNTER — Encounter: Payer: Self-pay | Admitting: Podiatry

## 2019-03-11 VITALS — BP 135/89 | HR 97 | Temp 97.8°F | Resp 16

## 2019-03-11 DIAGNOSIS — M2042 Other hammer toe(s) (acquired), left foot: Secondary | ICD-10-CM

## 2019-03-11 DIAGNOSIS — M2041 Other hammer toe(s) (acquired), right foot: Secondary | ICD-10-CM

## 2019-03-11 NOTE — Progress Notes (Signed)
Subjective:   Patient ID: Donna Strong, female   DOB: 58 y.o.   MRN: [id number]   HPI Patient states doing really well with surgery and states that she is having minimal discomfort and walking without pain   ROS      Objective:  Physical Exam  Neurovascular status intact negative Bevelyn Buckles' sign noted with fifth digit bilateral healing well wound edges well coapted and fifth metatarsal left doing well with minimal swelling and incisions well coapted     Assessment:  Overall clinically doing very well with postoperative surgery bilateral     Plan:  H&P reviewed conditions and recommended continued complete immobilization for the next 2 weeks.  Reapplied sterile dressing continued elevation and will be seen back for suture removal or earlier if any issues were to occur  X-ray indicates there is some stress on the fifth metatarsal left but screw is intact and it appears that healing should hopefully occur uneventfully but may require some secondary bone healing and we will continue with immobilization

## 2019-03-25 ENCOUNTER — Other Ambulatory Visit: Payer: Self-pay

## 2019-03-25 ENCOUNTER — Ambulatory Visit (INDEPENDENT_AMBULATORY_CARE_PROVIDER_SITE_OTHER): Payer: BC Managed Care – PPO

## 2019-03-25 ENCOUNTER — Ambulatory Visit (INDEPENDENT_AMBULATORY_CARE_PROVIDER_SITE_OTHER): Payer: BC Managed Care – PPO | Admitting: Podiatry

## 2019-03-25 ENCOUNTER — Other Ambulatory Visit: Payer: Self-pay | Admitting: Podiatry

## 2019-03-25 ENCOUNTER — Encounter: Payer: Self-pay | Admitting: Podiatry

## 2019-03-25 DIAGNOSIS — M2041 Other hammer toe(s) (acquired), right foot: Secondary | ICD-10-CM

## 2019-03-25 DIAGNOSIS — M2042 Other hammer toe(s) (acquired), left foot: Secondary | ICD-10-CM

## 2019-03-25 DIAGNOSIS — Z9889 Other specified postprocedural states: Secondary | ICD-10-CM

## 2019-03-27 NOTE — Progress Notes (Signed)
Subjective:   Patient ID: Donna Strong, female   DOB: 58 y.o.   MRN: [id number]   HPI Patient states overall doing well and I do get some swelling if I been on my foot too much.  Patient states overall pleased   ROS      Objective:  Physical Exam  Vascular status within normal limits with patient having negative Bevelyn Buckles' sign bilateral.  Patient's incision sites are healing well wound edges well coapted good alignment with stitches in place fifth digit bilateral    Assessment:  Doing well post osteotomy left hammertoe repair fifth left movement of the fifth metatarsal left but patient is continue to immobilize     Plan:  H&P stitches removed x-rays reviewed and advises patient continued immobilization left gradual return to activity more on the right and gradual increased movement.  Patient will continue compression elevation and will be seen back in 4 weeks or earlier if needed  X-rays indicate satisfactory section of bone digit 5 both feet with left fifth metatarsal showing secondary bone healing with stress but it should heal uneventfully at this position and I will continue to evaluated moving forward and we will continue with immobilization

## 2019-04-22 ENCOUNTER — Other Ambulatory Visit: Payer: Self-pay

## 2019-04-22 ENCOUNTER — Ambulatory Visit (INDEPENDENT_AMBULATORY_CARE_PROVIDER_SITE_OTHER): Payer: BC Managed Care – PPO | Admitting: Podiatry

## 2019-04-22 ENCOUNTER — Ambulatory Visit (INDEPENDENT_AMBULATORY_CARE_PROVIDER_SITE_OTHER): Payer: BC Managed Care – PPO

## 2019-04-22 ENCOUNTER — Encounter: Payer: Self-pay | Admitting: Podiatry

## 2019-04-22 VITALS — Temp 97.3°F

## 2019-04-22 DIAGNOSIS — M2041 Other hammer toe(s) (acquired), right foot: Secondary | ICD-10-CM

## 2019-04-22 DIAGNOSIS — M2042 Other hammer toe(s) (acquired), left foot: Secondary | ICD-10-CM | POA: Diagnosis not present

## 2019-04-25 NOTE — Progress Notes (Signed)
Subjective:   Patient ID: Donna Strong, female   DOB: 59 y.o.   MRN: [id number]   HPI Patient presents stating that overall the foot is doing well and she is wearing the compression stocking and has walked on her foot somewhat left foot   ROS      Objective:  Physical Exam  Neurovascular status intact negative Bevelyn Buckles' sign noted wound edges well coapted with good alignment of the fifth fourth digits and good healing of the fifth metatarsal left with mild swelling     Assessment:  Doing well post osteotomy digital procedures bilateral     Plan:  H&P conditions reviewed discussed continue being careful with the left foot with some movement of the bone but it should heal uneventfully but I want her to continue immobilize for several more weeks.  Reappoint 4 weeks or earlier if needed  X-rays indicate the osteotomy is healing with some secondary bone intent but it should heal uneventfully at this position with fixation in place and toes in good alignment

## 2019-06-03 ENCOUNTER — Other Ambulatory Visit: Payer: Self-pay

## 2019-06-03 ENCOUNTER — Ambulatory Visit (INDEPENDENT_AMBULATORY_CARE_PROVIDER_SITE_OTHER): Payer: BC Managed Care – PPO

## 2019-06-03 ENCOUNTER — Encounter: Payer: Self-pay | Admitting: Podiatry

## 2019-06-03 ENCOUNTER — Ambulatory Visit (INDEPENDENT_AMBULATORY_CARE_PROVIDER_SITE_OTHER): Payer: BC Managed Care – PPO | Admitting: Podiatry

## 2019-06-03 DIAGNOSIS — M2041 Other hammer toe(s) (acquired), right foot: Secondary | ICD-10-CM

## 2019-06-03 DIAGNOSIS — M2042 Other hammer toe(s) (acquired), left foot: Secondary | ICD-10-CM

## 2019-06-06 NOTE — Progress Notes (Signed)
Subjective:   Patient ID: Donna Strong, female   DOB: 59 y.o.   MRN: [id number]   HPI Patient states overall doing well and very satisfied with how she is doing with occasional discomfort swelling especially the left foot neuro   ROS      Objective:  Physical Exam  Vascular status intact negative Bevelyn Buckles' sign noted patient's feet healing well wound edges well coapted digits in good alignment with the left lateral foot showing mild swelling associated with osteotomy     Assessment:  Doing well post foot surgery bilateral     Plan:  H&P x-rays reviewed and at this point I have recommended that she return to normal activity and that she may have swelling which can persist for a couple months but overall it appears she is on the road towards recovery.  Patient is encouraged to call with questions concerns  X-rays indicate that there is good alignment noted no signs of pathology with fixation in place

## 2019-10-02 ENCOUNTER — Other Ambulatory Visit: Payer: Self-pay

## 2019-10-02 ENCOUNTER — Ambulatory Visit (HOSPITAL_COMMUNITY)
Admission: RE | Admit: 2019-10-02 | Discharge: 2019-10-02 | Disposition: A | Discharge status: Home / Self Care | Payer: BC Managed Care – PPO | Source: Ambulatory Visit | Attending: Urology | Admitting: Urology

## 2019-10-02 ENCOUNTER — Ambulatory Visit (INDEPENDENT_AMBULATORY_CARE_PROVIDER_SITE_OTHER): Payer: BC Managed Care – PPO | Admitting: Urology

## 2019-10-02 ENCOUNTER — Encounter: Payer: Self-pay | Admitting: Urology

## 2019-10-02 VITALS — BP 127/72 | HR 94 | Temp 96.8°F | Ht 70.0 in | Wt 195.0 lb

## 2019-10-02 DIAGNOSIS — Q6102 Congenital multiple renal cysts: Secondary | ICD-10-CM

## 2019-10-02 LAB — POCT URINALYSIS DIPSTICK
Bilirubin, UA: NEGATIVE
Clarity, UA: 6
Glucose, UA: NEGATIVE
Ketones, UA: NEGATIVE
Leukocytes, UA: NEGATIVE
Nitrite, UA: NEGATIVE
Protein, UA: POSITIVE — AB
Spec Grav, UA: 1.02 (ref 1.010–1.025)
Urobilinogen, UA: 0.2 E.U./dL
pH, UA: 6 (ref 5.0–8.0)

## 2019-10-02 NOTE — Progress Notes (Signed)

## 2019-10-02 NOTE — Progress Notes (Signed)
10/02/2019 3:44 PM   Donna Strong [birth year] [id number]  Referring provider: Monico Blitz, MD [address] [place],  [place] [address]  Right renal cyst  HPI: Donna Strong is a 59yo her for followup for renal cysts. No flank pain since last visit. No LUTS.no hematuria. No UTIs since last vist.  Her records from [facility] are as follows: I have pain in the flank. HPI: Donna Strong is a 58 year-old female established patient who is here for flank pain.  The problem is on the right side. Her pain started about approximately 07/27/2018. The pain is sharp. The intensity of her pain is rated as a 3. The pain is constant. The pain does radiate.   Resting and not moving< makes the pain better. Sitting makes the pain worse. She has not been treated with any pain medications.   She has not had this same pain previously. She has not had kidney stones.   01/10/2019: She developed right flank pain in May 2020. She underwent 2 CT which shows right renal cysts 12 and 65mm. She then underwent MRI in 10/2018. MRi shows right simple renal cysts and right perinephric fluid consistent with ruptured calyx versus pyelonephritis    PMH: Past Medical History:  Diagnosis Date  . H/O thoracic outlet syndrome   . Hypothyroidism   . Tumor associated pain    pitutary    Surgical History: Past Surgical History:  Procedure Laterality Date  . ABDOMINAL HYSTERECTOMY    . BONE RESECTION, RIB    . COLONOSCOPY N/A 01/28/2013   Procedure: COLONOSCOPY;  Surgeon: Danie Binder, MD;  [facility];  Service: Endoscopy;  Laterality: N/A;  9:30 AM-moved to 8:30 Melanie notified pt  . COLONOSCOPY N/A 12/07/2018   Procedure: COLONOSCOPY;  Surgeon: Danie Binder, MD;  [facility];  Service: Endoscopy;  Laterality: N/A;  2:00pm  . EAR CYST EXCISION Right 07/02/2012   Procedure: EXCISION OF SEBACEOUS CYST OF SCALP;  Surgeon: Donato Heinz, MD;  Location: [facility];  Service: General;  Laterality: Right;  .  EAR CYST EXCISION Left 12/2011  . POLYPECTOMY  12/07/2018   Procedure: POLYPECTOMY;  Surgeon: Danie Binder, MD;  [facility];  Service: Endoscopy;;  . THYROID LOBECTOMY Right 07/12/2014   with frozen section     DR ROSEN  . THYROIDECTOMY Right 07/11/2013   Procedure: RIGHT THYROID LOBECTOMY WITH FROZEN SECTION;  Surgeon: Izora Gala, MD;  Location: [facility];  Service: ENT;  Laterality: Right;    Home Medications:  Allergies as of 10/02/2019      Reactions   Codeine Nausea And Vomiting   Must have promethazine if she takes.      Medication List       Accurate as of October 02, 2019  3:44 PM. If you have any questions, ask your nurse or doctor.        levothyroxine 50 MCG tablet Commonly known as: SYNTHROID Take 50 mcg by mouth daily before breakfast.   multivitamin with minerals tablet Take 1 tablet by mouth daily.   triamcinolone cream 0.1 % Commonly known as: KENALOG       Allergies:  Allergies  Allergen Reactions  . Codeine Nausea And Vomiting    Must have promethazine if she takes.    Family History: Family History  Problem Relation Age of Onset  . Colon cancer Other        diagnosed in his 17s  . Colon cancer Maternal  Uncle        diagnosed in 20s  . Colon polyps Paternal Aunt        per patient, non-cancerous    Social History:  reports that she has never smoked. She has never used smokeless tobacco. She reports that she does not drink alcohol and does not use drugs.  ROS: All other review of systems were reviewed and are negative except what is noted above in HPI  Physical Exam: BP 127/72   Pulse 94   Temp (!) 96.8 F (36 C)   Ht 5\' 10"  (1.778 m)   Wt 195 lb (88.5 kg)   BMI 27.98 kg/m   Constitutional:  Alert and oriented, No acute distress. HEENT: [place] AT, moist mucus membranes.  Trachea midline, no masses. Cardiovascular: No clubbing, cyanosis, or edema. Respiratory: Normal respiratory effort, no increased work of breathing. GI:  Abdomen is soft, nontender, nondistended, no abdominal masses GU: No CVA tenderness.  Lymph: No cervical or inguinal lymphadenopathy. Skin: No rashes, bruises or suspicious lesions. Neurologic: Grossly intact, no focal deficits, moving all 4 extremities. Psychiatric: Normal mood and affect.  Laboratory Data: Lab Results  Component Value Date   WBC 13.0 (H) 08/24/2018   HGB 12.3 08/24/2018   HCT 37.5 08/24/2018   MCV 89.7 08/24/2018   PLT 345 08/24/2018    Lab Results  Component Value Date   CREATININE 0.83 08/24/2018    No results found for: PSA  No results found for: TESTOSTERONE  No results found for: HGBA1C  Urinalysis    Component Value Date/Time   COLORURINE YELLOW 08/24/2018 2054   APPEARANCEUR CLEAR 08/24/2018 2054   LABSPEC 1.020 08/24/2018 2054   [place] 5.5 08/24/2018 2054   GLUCOSEU NEGATIVE 08/24/2018 2054   HGBUR TRACE (A) 08/24/2018 2054   [place] 08/24/2018 2054   [place] NEGATIVE 08/24/2018 2054   PROTEINUR 100 (A) 08/24/2018 2054   NITRITE NEGATIVE 08/24/2018 2054   LEUKOCYTESUR NEGATIVE 08/24/2018 2054    Lab Results  Component Value Date   BACTERIA FEW (A) 08/24/2018    Pertinent Imaging: Renal US 10/02/2019: Images reviewed and discussed with the patient No results found for this or any previous visit.  No results found for this or any previous visit.  No results found for this or any previous visit.  No results found for this or any previous visit.  No results found for this or any previous visit.  No results found for this or any previous visit.  No results found for this or any previous visit.  No results found for this or any previous visit.   Assessment & Plan:   1. Right upper pole renal cyst: The cyst is stable in size. We will see her back in 1 year with a renal US   No follow-ups on file.  Nicolette Bang, MD  [facility] [place]

## 2019-10-04 ENCOUNTER — Other Ambulatory Visit: Payer: Self-pay

## 2019-10-04 ENCOUNTER — Telehealth: Payer: Self-pay

## 2019-10-04 ENCOUNTER — Telehealth: Payer: Self-pay | Admitting: Urology

## 2019-10-04 DIAGNOSIS — Q6102 Congenital multiple renal cysts: Secondary | ICD-10-CM

## 2019-10-04 NOTE — Telephone Encounter (Signed)
-----   Message from Cleon Gustin, MD sent at 10/04/2019  8:16 AM EDT ----- Renal US shows a stable right renal cyst and area of scar related to her previous fluid collection/infection. Overall [place] looks good. She should followup in 1 year with a renal US ----- Message ----- From: Dorisann Frames, RN Sent: 10/03/2019   8:22 AM EDT To: Cleon Gustin, MD  Please review

## 2019-10-04 NOTE — Telephone Encounter (Signed)
See other task

## 2019-10-04 NOTE — Telephone Encounter (Signed)
Pt notified of results. appt scheduled and u/s ordered.

## 2019-10-04 NOTE — Telephone Encounter (Signed)
Pt called for xray results?

## 2019-10-18 ENCOUNTER — Encounter: Payer: Self-pay | Admitting: Urology

## 2019-10-18 NOTE — Patient Instructions (Signed)
Renal Mass  A renal mass is a growth in the kidney. A renal mass may be found while performing an MRI, CT scan, or ultrasound for other problems of the abdomen. Certain types of cancers, infections, or injuries can cause a renal mass. A renal mass that is cancerous (malignant) may grow or spread quickly. Others are harmless (benign). What are common types of renal masses? Renal masses include:  Tumors. These may be cancerous (malignant) or noncancerous (benign). ? The most common type of kidney cancer is renal cell carcinoma. ? The most common benign tumors of the kidney include renal adenomas, oncocytomas, and angiomyolipoma (AML).  Cysts. These are fluid-filled sacs that form on or in the kidney. ? It is not always known what causes a cyst to develop in or on the kidney. ? Most kidney cysts do not cause symptoms and do not need to be treated. What type of testing might I need? Your health care provider may recommend that you have tests to diagnose the cause of your renal mass. The following tests may be done if a renal mass is found:  Physical exam.  Blood tests.  Urine tests.  Imaging tests, such as ultrasound, CT scan, or MRI.  Biopsy. This is a small sample that is removed from the renal mass and tested in a lab. The exact tests and how often they are done will depend on:  The size and appearance of the renal mass.  Risk factors or medical conditions that increase your risk for problems.  Any symptoms associated with the renal mass, or concerns that you have about it. Tests and physical exams may be done once, or they may be done regularly for a period of time. Tests and exams that are done regularly will help monitor whether the mass is growing and beginning to cause problems. What are common treatments for renal masses? Treatment is not always needed for this condition. Your health care provider may recommend careful monitoring (watchful waiting) and regular tests and exams.  Treatment will depend on the cause of the mass. Follow these instructions at home: What you need to do at home will depend on the cause of the mass. Follow the instructions that your health care provider gives to you. In general:  Take over-the-counter and prescription medicines only as told by your health care provider.  If you are prescribed an antibiotic medicine, take it as told by your health care provider. Do not stop taking the antibiotic even if you start to feel better.  Follow any restrictions that are given to you by your health care provider.  Keep all follow-up visits as told by your health care provider. This is important. ? You may need to see your health care provider once or twice a year to have CT scans and ultrasounds done. These tests will show if your renal mass has changed or grown bigger. Contact a health care provider if you:  Have pain in the side or back (flank pain).  Have a fever.  Feel full soon after eating.  Have pain or swelling in the abdomen.  Lose weight. Get help right away if:  Your pain gets worse.  There is blood in your urine.  You cannot urinate.  You have chest pain.  You have trouble breathing. Summary  A renal mass is a growth in the kidney. It may be cancerous (malignant) and grow or spread quickly, or it may be harmless (benign).  Renal masses may be found while performing   an MRI, CT scan, or ultrasound for other problems of the abdomen.  Your health care provider may recommend that you have tests to diagnose the cause of your renal mass. This may include a physical exam, blood tests, urine tests, imaging, or a biopsy.  Treatment is not always needed for this condition. Careful monitoring (watchful waiting) may be recommended. This information is not intended to replace advice given to you by your health care provider. Make sure you discuss any questions you have with your health care provider. Document Revised: 04/20/2017  Document Reviewed: 04/20/2017 Elsevier Patient Education  2020 Elsevier Inc.  

## 2020-09-30 ENCOUNTER — Ambulatory Visit: Payer: BC Managed Care – PPO | Admitting: Urology

## 2020-10-19 ENCOUNTER — Ambulatory Visit: Payer: BC Managed Care – PPO | Admitting: Urology

## 2020-10-30 ENCOUNTER — Other Ambulatory Visit (HOSPITAL_COMMUNITY): Payer: Self-pay | Admitting: Internal Medicine

## 2020-10-30 ENCOUNTER — Other Ambulatory Visit (HOSPITAL_COMMUNITY): Payer: Self-pay | Admitting: Obstetrics and Gynecology

## 2020-10-30 DIAGNOSIS — Z1231 Encounter for screening mammogram for malignant neoplasm of breast: Secondary | ICD-10-CM

## 2020-11-02 ENCOUNTER — Ambulatory Visit (HOSPITAL_COMMUNITY)
Admission: RE | Admit: 2020-11-02 | Discharge: 2020-11-02 | Disposition: A | Discharge status: Home / Self Care | Payer: BC Managed Care – PPO | Source: Ambulatory Visit | Attending: Internal Medicine | Admitting: Internal Medicine

## 2020-11-02 ENCOUNTER — Encounter (HOSPITAL_COMMUNITY): Payer: Self-pay

## 2020-11-02 ENCOUNTER — Other Ambulatory Visit: Payer: Self-pay

## 2020-11-02 DIAGNOSIS — Z1231 Encounter for screening mammogram for malignant neoplasm of breast: Secondary | ICD-10-CM | POA: Diagnosis not present

## 2020-11-16 IMAGING — CT CT ABDOMEN AND PELVIS WITH CONTRAST
2 of 5 series · 15 of 46 positions shown, 17 images · IV contrast (omnipaque)
Comparison: 08/24/2018

CLINICAL DATA: Right lower quadrant pain, prior diverticulitis

EXAM:
CT ABDOMEN AND PELVIS WITH CONTRAST
TECHNIQUE: Multidetector CT imaging of the abdomen and pelvis was performed
using the standard protocol following bolus administration of
intravenous contrast.
CONTRAST:  100mL OMNIPAQUE IOHEXOL 300 MG/ML  SOLN

[Series 3: axial st · axial · 0.68mm/px · z∈[+1043,+1498]mm · 12 of 106 slices shown, 14 images]
[im 8/106  soft-tissue]
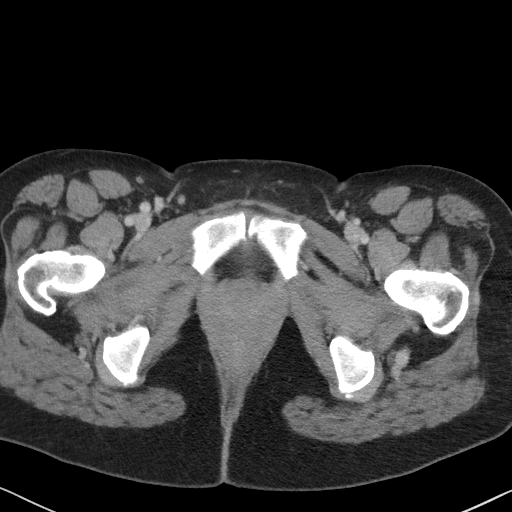
[im 8/106  bone]
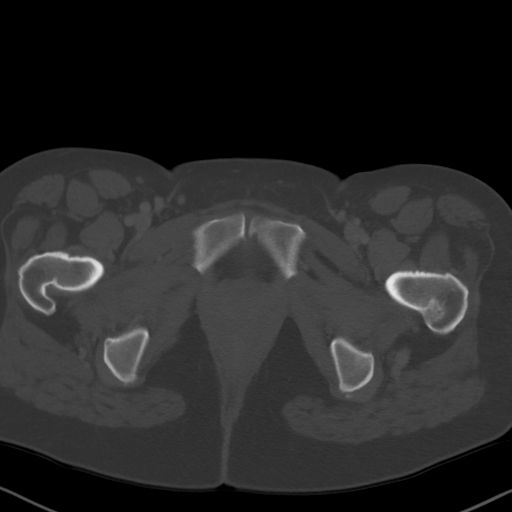
[im 15/106  soft-tissue]
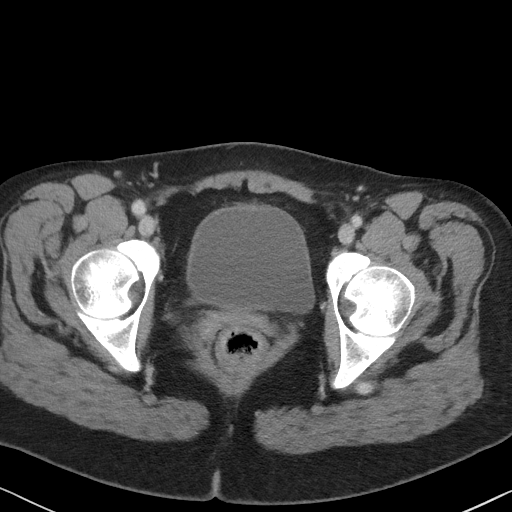
[im 22/106  soft-tissue]
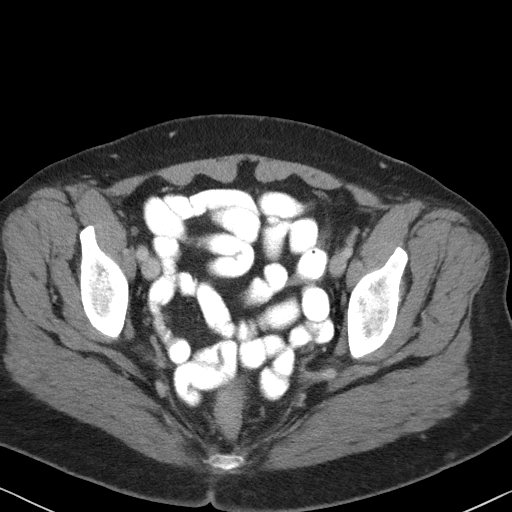
[im 36/106  soft-tissue]
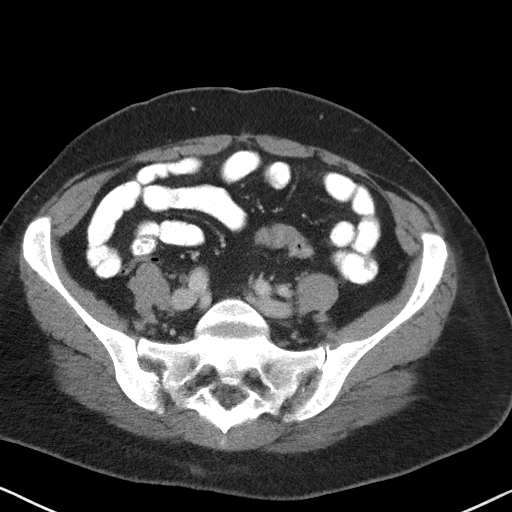
[im 43/106  soft-tissue]
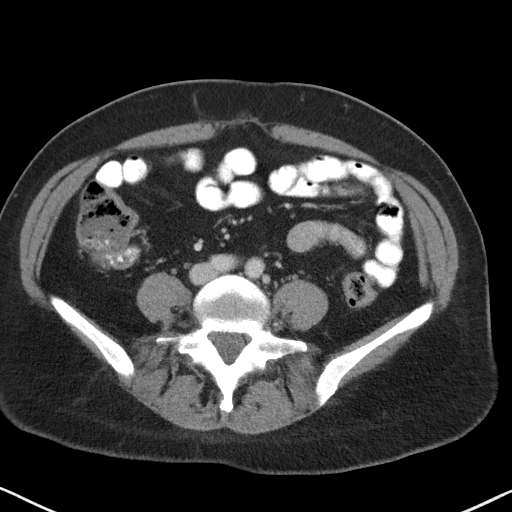
[im 50/106  soft-tissue]
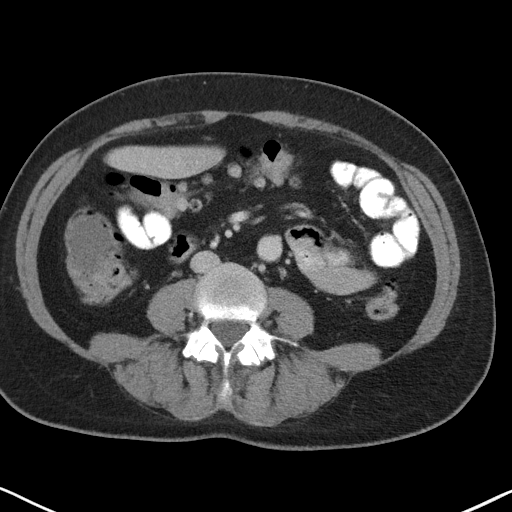
[im 57/106  soft-tissue]
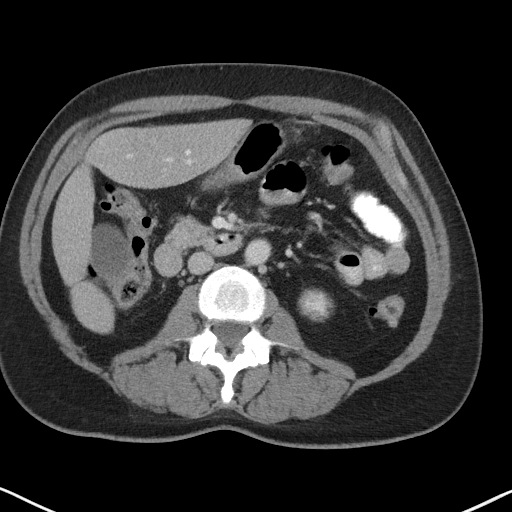
[im 64/106  soft-tissue]
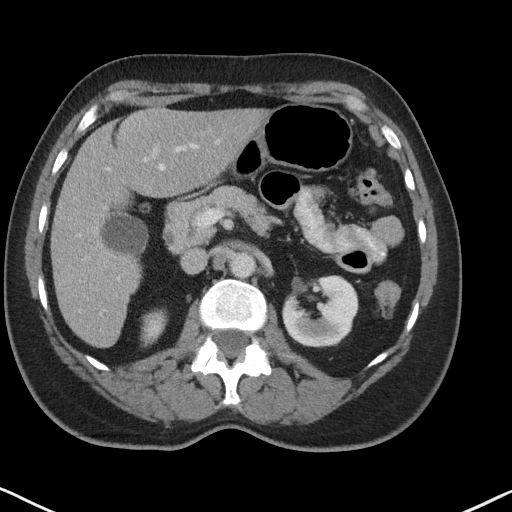
[im 71/106  soft-tissue]
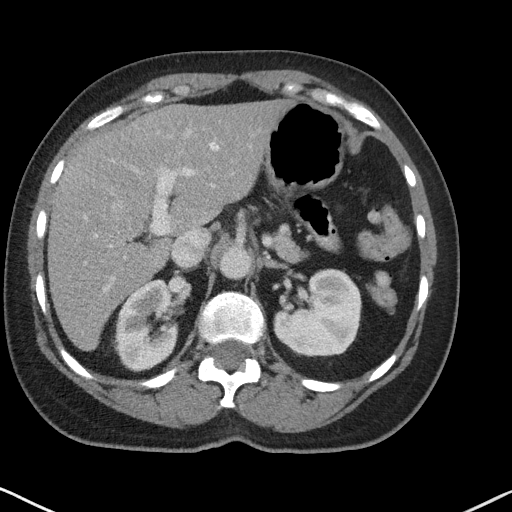
[im 71/106  bone]
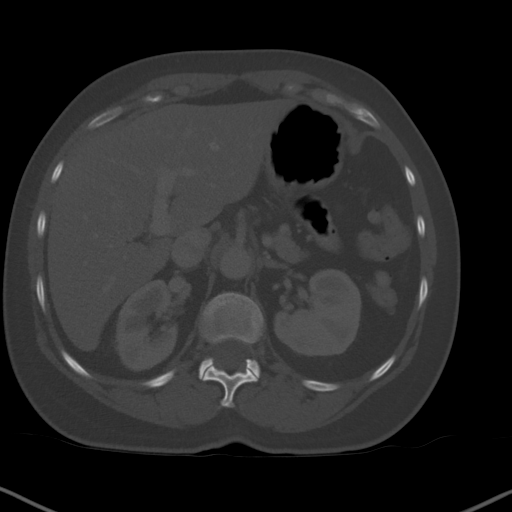
[im 85/106  soft-tissue]
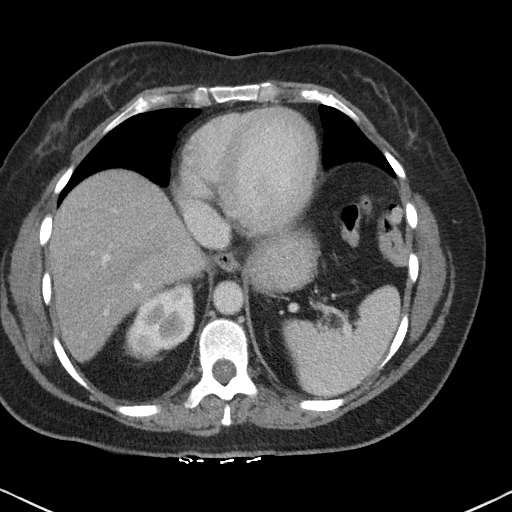
[im 92/106  soft-tissue]
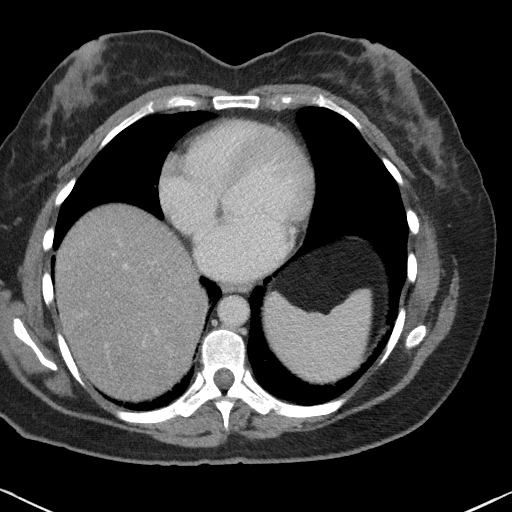
[im 99/106  soft-tissue]
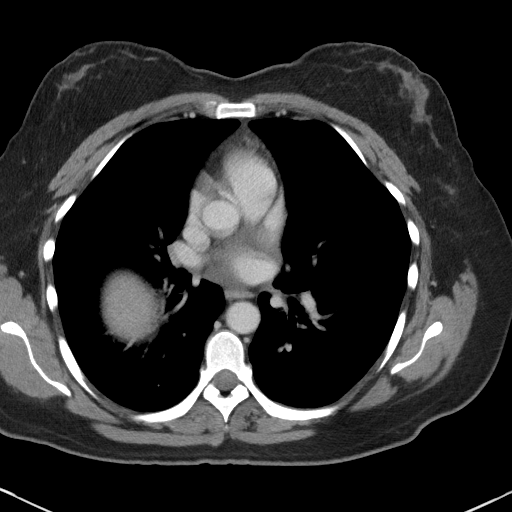

[Series 7: coronal st · coronal · 0.71mm/px · 3 of 117 slices shown]
[im 39/117  soft-tissue]
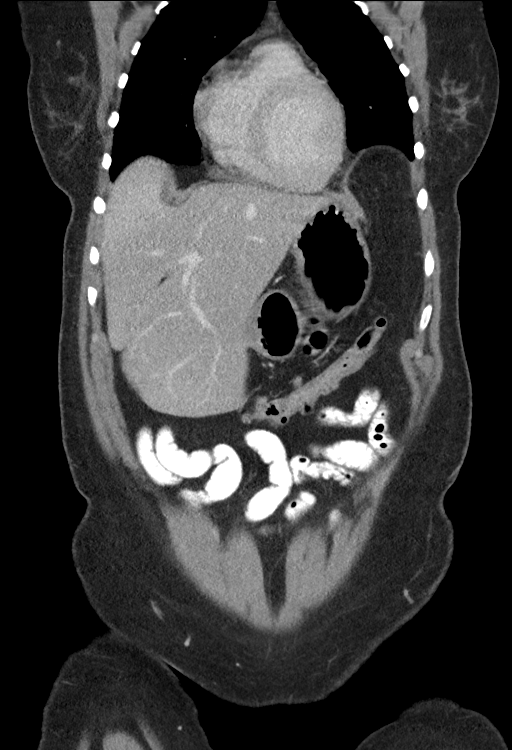
[im 52/117  soft-tissue]
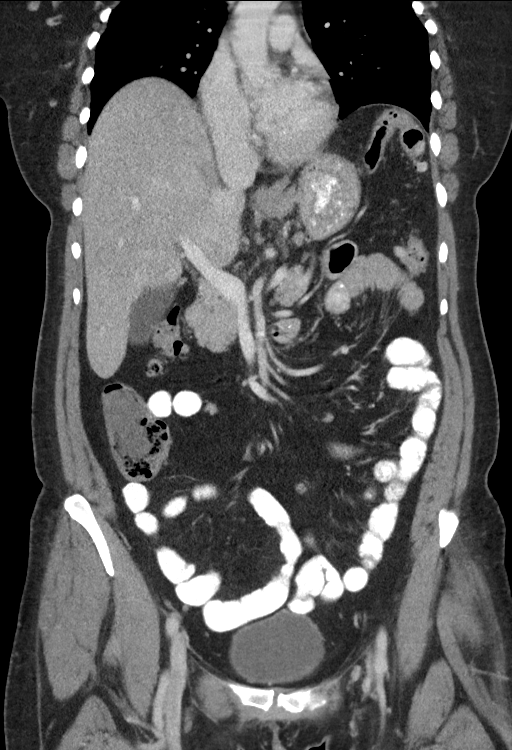
[im 65/117  soft-tissue]
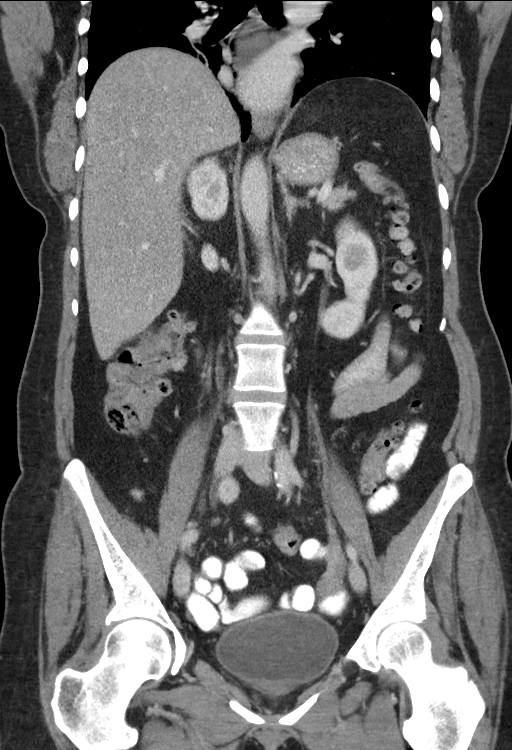

[15 of 46 positions shown; findings below may reference images not displayed]

FINDINGS: Lower chest: Minimal dependent atelectasis in the bilateral lower
lobes.

Hepatobiliary: Liver is within normal limits.

Gallbladder is unremarkable. No intrahepatic or extrahepatic ductal
dilatation.

Pancreas: Within normal limits.

Spleen: Within normal limits.

Adrenals/Urinary Tract: Adrenal glands are within normal limits.

Small right renal cysts measuring up to 13 mm in the lateral right
upper pole (series 4/image 14). Left kidney is within normal limits.
No hydronephrosis.

Bladder is within normal limits.

Stomach/Bowel: Stomach is within normal limits.

No evidence of bowel obstruction.

Normal appendix (series 3/image 71).

Extensive colonic diverticulosis, including involving the right
colon, but without pericolonic inflammatory changes to suggest acute
diverticulitis.

Vascular/Lymphatic: No evidence of abdominal aortic aneurysm.

Mild atherosclerotic calcifications of the bilateral iliac vessels.

No suspicious abdominopelvic lymphadenopathy.

Reproductive: Status post hysterectomy.

Left ovary is within normal limits.  No right adnexal mass.

Other: No abdominopelvic ascites.

Musculoskeletal: Visualized osseous structures are within normal
limits.
IMPRESSION: Extensive colonic diverticulosis, but without pericolonic
inflammatory changes to suggest acute diverticulitis.

No evidence of bowel obstruction.  Normal appendix.

Additional ancillary findings as above.

## 2021-09-04 ENCOUNTER — Emergency Department: Admit: 2021-09-04 | Payer: PRIVATE HEALTH INSURANCE

## 2021-09-04 ENCOUNTER — Inpatient Hospital Stay: Admit: 2021-09-04 | Discharge: 2021-09-04 | Payer: Medicaid (Managed Care) | Attending: Emergency Medicine

## 2021-09-04 DIAGNOSIS — R109 Unspecified abdominal pain: Secondary | ICD-10-CM

## 2021-09-04 DIAGNOSIS — Z880 Allergy status to penicillin: Secondary | ICD-10-CM

## 2021-09-04 DIAGNOSIS — R1031 Right lower quadrant pain: Secondary | ICD-10-CM

## 2021-09-04 DIAGNOSIS — Z885 Allergy status to narcotic agent status: Secondary | ICD-10-CM

## 2021-09-04 LAB — URINALYSIS WITH CULTURE REFLEX      (BH LMW YH)
BKR BILIRUBIN, UA: NEGATIVE
BKR BLOOD, UA: NEGATIVE
BKR GLUCOSE, UA: NEGATIVE
BKR KETONES, UA: NEGATIVE
BKR LEUKOCYTE ESTERASE, UA: NEGATIVE
BKR NITRITE, UA: NEGATIVE
BKR PH, UA: 6.5 (ref 5.5–7.5)
BKR SPECIFIC GRAVITY, UA: 1.011 (ref 1.005–1.030)
BKR UROBILINOGEN, UA (MG/DL): <2 mg/dL (ref <=2.0)

## 2021-09-04 LAB — UA REFLEX CULTURE

## 2021-09-04 LAB — CBC WITH AUTO DIFFERENTIAL
BKR WAM ABSOLUTE IMMATURE GRANULOCYTES.: 0.03 x 1000/ÂµL (ref 0.00–0.30)
BKR WAM ABSOLUTE LYMPHOCYTE COUNT.: 1.6 x 1000/ÂµL (ref 0.60–3.70)
BKR WAM ABSOLUTE NRBC (2 DEC): 0 x 1000/ÂµL (ref 0.00–1.00)
BKR WAM ANALYZER ANC: 8.68 x 1000/ÂµL — ABNORMAL HIGH (ref 2.00–7.60)
BKR WAM BASOPHIL ABSOLUTE COUNT.: 0.05 x 1000/ÂµL (ref 0.00–1.00)
BKR WAM BASOPHILS: 0.4 % (ref 0.0–1.4)
BKR WAM EOSINOPHIL ABSOLUTE COUNT.: 0.1 x 1000/ÂµL (ref 0.00–1.00)
BKR WAM EOSINOPHILS: 0.9 % (ref 0.0–5.0)
BKR WAM HEMATOCRIT (2 DEC): 35.2 % (ref 35.00–45.00)
BKR WAM HEMOGLOBIN: 11.8 g/dL (ref 11.7–15.5)
BKR WAM IMMATURE GRANULOCYTES: 0.3 % (ref 0.0–1.0)
BKR WAM LYMPHOCYTES: 14.3 % — ABNORMAL LOW (ref 17.0–50.0)
BKR WAM MCH (PG): 29.4 pg (ref 27.0–33.0)
BKR WAM MCHC: 33.5 g/dL (ref 31.0–36.0)
BKR WAM MCV: 87.8 fL (ref 80.0–100.0)
BKR WAM MONOCYTE ABSOLUTE COUNT.: 0.72 x 1000/ÂµL (ref 0.00–1.00)
BKR WAM MONOCYTES: 6.4 % (ref 4.0–12.0)
BKR WAM MPV: 10.5 fL (ref 8.0–12.0)
BKR WAM NEUTROPHILS: 77.7 % — ABNORMAL HIGH (ref 39.0–72.0)
BKR WAM NUCLEATED RED BLOOD CELLS: 0 % (ref 0.0–1.0)
BKR WAM PLATELETS: 320 x1000/ÂµL (ref 150–420)
BKR WAM RDW-CV: 12.3 % (ref 11.0–15.0)
BKR WAM RED BLOOD CELL COUNT.: 4.01 M/ÂµL (ref 4.00–6.00)
BKR WAM WHITE BLOOD CELL COUNT: 11.2 x1000/ÂµL — ABNORMAL HIGH (ref 4.0–11.0)

## 2021-09-04 LAB — BASIC METABOLIC PANEL
BKR ANION GAP: 13 (ref 7–17)
BKR BLOOD UREA NITROGEN: 8 mg/dL (ref 6–20)
BKR BUN / CREAT RATIO: 9.8 (ref 8.0–23.0)
BKR CALCIUM: 9.6 mg/dL (ref 8.8–10.2)
BKR CHLORIDE: 106 mmol/L (ref 98–107)
BKR CO2: 22 mmol/L (ref 20–30)
BKR CREATININE: 0.82 mg/dL (ref 0.40–1.30)
BKR EGFR, CREATININE (CKD-EPI 2021): >60 mL/min/{1.73_m2} (ref >=60)
BKR GLUCOSE: 99 mg/dL (ref 70–100)
BKR POTASSIUM: 4.1 mmol/L (ref 3.3–5.3)
BKR SODIUM: 141 mmol/L (ref 136–144)

## 2021-09-04 LAB — URINE MICROSCOPIC     (BH GH LMW YH)
BKR RBC/HPF INSTRUMENT: 1 /HPF (ref 0–2)
BKR URINE SQUAMOUS EPITHELIAL CELLS, UA (NUMERIC): <1 /HPF (ref 0–5)
BKR WBC/HPF INSTRUMENT: 2 /HPF (ref 0–5)

## 2021-09-04 LAB — HEPATIC FUNCTION PANEL
BKR A/G RATIO: 1.7 (ref 1.0–2.2)
BKR ALANINE AMINOTRANSFERASE (ALT): 134 U/L — ABNORMAL HIGH (ref 10–35)
BKR ALBUMIN: 4.7 g/dL (ref 3.6–4.9)
BKR ALKALINE PHOSPHATASE: 131 U/L — ABNORMAL HIGH (ref 9–122)
BKR ASPARTATE AMINOTRANSFERASE (AST): 151 U/L — ABNORMAL HIGH (ref 10–35)
BKR AST/ALT RATIO: 1.1
BKR BILIRUBIN DIRECT: 0.2 mg/dL (ref <=0.3)
BKR BILIRUBIN TOTAL: 0.9 mg/dL (ref <=1.2)
BKR GLOBULIN: 2.8 g/dL (ref 2.3–3.5)
BKR PROTEIN TOTAL: 7.5 g/dL (ref 6.6–8.7)

## 2021-09-04 LAB — LIPASE: BKR LIPASE: 41 U/L (ref 11–55)

## 2021-09-04 MED ORDER — SODIUM CHLORIDE 0.9 % LARGE VOLUME SYRINGE FOR AUTOINJECTOR
Freq: Once | INTRAVENOUS | Status: CP | PRN
Start: 2021-09-04 — End: ?
  Administered 2021-09-04: 15:00:00 via INTRAVENOUS

## 2021-09-04 MED ORDER — IOHEXOL 350 MG IODINE/ML INTRAVENOUS SOLUTION
350 mg iodine/mL | Freq: Once | INTRAVENOUS | Status: CP | PRN
Start: 2021-09-04 — End: ?
  Administered 2021-09-04: 15:00:00 350 mL via INTRAVENOUS

## 2021-09-04 NOTE — Discharge Instructions
You were seen in the emergency department on 09/04/2021 to be evaluated for abdominal pain. Your urine test was negative for infection, so you can discontinue the antibiotics prescribed. You are being safely discharged home at this time, but please arrange an appointment with your primary care doctor as soon as possible for follow-up. Return to the emergency department right away if you experience new or worsening symptoms.Thank you for trusting [place] with your care. We are available 24 hours a day, 7 days a week if we can help in the future.

## 2021-09-04 NOTE — ED Provider Notes
Chief Complaint Patient presents with ? Abdominal Pain PGY-4 Emergency Medicine Resident MDM: ----------------------------------------------------------------------------Presentation: Patient is a 61 y.o. female who presents with right flank/abdominal pain. Lives in [place] but currently visiting family in [place]. Patient began feeling vague right flank pain about a week ago; seen by her PCP in [place] who did a UA/culture and started her on empiric antibiotics. Yesterday had episode of severe right lower quadrant pain associated with nausea, seen at urgent care and they recommended outpatient [place]. Pain has improved but patient presents to ED because it nonetheless persists and outpatient imaging is closed. On evaluation, BP 159/100 but vitals otherwise WNL. Patient is alert and in no acute distress.  Abdomen soft with mild TTP in the RLQ, no rebound or guarding, no CVA tenderness.ZOX:[id number] includes appendicitis, nephrolithiasis, UTI, diverticulitis, low concern for ovarian pathology.  Benign abdominal exam.  Plan for labs, [place] abdomen/pelvis.  Offered pain control, patient declines at this time but is encouraged to ask if needed.ED Course:Reviewed [place], only significant for diverticulosis without diverticulitis. UA shows no UTI. Discussed results with patient, recommended she discontinue the antibiotics prescribed which she is amenable to do. Discharged home with detailed return precautions.This patient was presented to and discussed with attending physician and a treatment plan and disposition were collaboratively agreed upon. Discussed follow-up recommendations and return precautions.Raj Janus, MDPGY-4 Emergency MedicineAvailable on MHB---------------------------------------------------------------------------------------------------------------------Care limited by SDOH: Ambulatory Care Access.Independent interpretation of: [place]  Physical ExamED Triage Vitals [09/04/21 0821]BP: (!) 159/100Pulse: 89Pulse from  O2 sat: n/aResp: 16Temp: 97.8 ?F (36.6 ?C)Temp src: n/aSpO2: 100 % BP (!) 148/89  - Pulse 81  - Temp 99.1 ?F (37.3 ?C) (Oral)  - Resp 18  - Ht 5' 9 (1.753 m)  - Wt 87.1 kg  - SpO2 100%  - BMI 28.35 kg/m? Physical Exam ProceduresAttestation/Critical CarePatient Reevaluation: Attending Supervised: ResidentHistory was obtained from patient. I saw and examined the patient. I agree with the findings and plan of care as documented in the resident's note except as noted below. Additional acute and/or chronic problems addressed: 61 y.o. female prior h/o R-sided kidney stones, p/w R flank pain since Monday. Had UA/Cx collected by PCP in [place]. Seen in gyn yesterday and was thought to have UTI, started on bactrim. Presents today after 3 episodes of diarrhea and worsening of her pain. Chills yesterday. Was seen in urgent care yesterday and scheduled for outpatient [place], but had worsening pain, which prompted her to present to ED here.CV: RRR.   Resp: Breathing comfortably on ambient air with no distress. GI/GU: Non-distended. Soft. Mild TTP in RUQ and RLQ. No rebound/guarding. Negative obturator.  No CVAT.Patient lives in [place], is visiting here; we do not have access to her prior medical records.Concern for nephrolithiasis, so will get [place] to assess for stone/hydronephrosis. On [place] will also evaluate for mass, diverticulitis. Her UA today has no e/o infection, so less concern for an infected stone. Hypertensive likely iso pain. Afebrile and normal HR, doubt sepsis. Other potential diagnoses include appendicitis, ovarian torsion, ovarian cyst, hernia, less likely TOA.Nyles Mitton FofanaDictation device and software may have been used during this encounter, please excuse any spelling, word substitution, or punctuation errors. Comments as of 09/04/21 1345 Sat Sep 04, 2021 1200 No acute findings in abdomen/pelvis. Patient's pain has been well controlled during her time in the ED. Plan for discharge home. [MF]  Comments User Index[MF] Otis Peak, MD   Clinical Impressions as of 09/04/21 1345 Right lower quadrant pain  ED DispositionDischarge Otis Peak, MD06/10/23 1658

## 2021-09-04 NOTE — ED Notes
9:02 AM Pt presents to ED from walk-in triage w/ c/o abdominal pain.  Pt reports R flank pain that developed to RLQ abd pain radiating down the R leg.  Pt reports pain as pressure that's a 7/10.  Saw her PCP in [place] and had urinalysis & cultures done in potential concern for UTI, was started on bactrim & nitrofauntoin.  Yesterday, pt became lightheaded & dizzy, reports having low blood pressure, and a couple episodes nausea & diarrhea.  Pt went to an urgent care where they recommended she have an abd ultrasound done.  Reports good PO intake.  Denies vomiting, f/c, CP/SOB & urinary sx.  A&Ox4, respirations even & unlabored, provider at bedside.10:40 AM Pt to [place] scan via hospital transport.11:08 AM Pt returned from [place] scan.  Pt resting comfortably, no complaints at this time.  Pending [place] scan results.12:09 PM Provider at the bedside discussing results and plan of care.12:28 PM Discharge papers provided by provider.  Vitals obtained, PIV removed.  Pt ambulatory w/ steady gait on departure.

## 2022-01-27 ENCOUNTER — Other Ambulatory Visit (HOSPITAL_COMMUNITY): Payer: Self-pay | Admitting: Internal Medicine

## 2022-01-27 DIAGNOSIS — Z1231 Encounter for screening mammogram for malignant neoplasm of breast: Secondary | ICD-10-CM

## 2022-02-07 ENCOUNTER — Ambulatory Visit (HOSPITAL_COMMUNITY)
Admission: RE | Admit: 2022-02-07 | Discharge: 2022-02-07 | Disposition: A | Discharge status: Home / Self Care | Payer: BC Managed Care – PPO | Source: Ambulatory Visit | Attending: Internal Medicine | Admitting: Internal Medicine

## 2022-02-07 DIAGNOSIS — Z1231 Encounter for screening mammogram for malignant neoplasm of breast: Secondary | ICD-10-CM | POA: Insufficient documentation

## 2023-03-27 ENCOUNTER — Other Ambulatory Visit (HOSPITAL_COMMUNITY): Payer: Self-pay | Admitting: Internal Medicine

## 2023-03-27 DIAGNOSIS — Z1231 Encounter for screening mammogram for malignant neoplasm of breast: Secondary | ICD-10-CM

## 2023-04-03 ENCOUNTER — Ambulatory Visit (HOSPITAL_COMMUNITY): Payer: BC Managed Care – PPO

## 2023-04-10 ENCOUNTER — Ambulatory Visit (HOSPITAL_COMMUNITY)
Admission: RE | Admit: 2023-04-10 | Discharge: 2023-04-10 | Disposition: A | Discharge status: Home / Self Care | Payer: BC Managed Care – PPO | Source: Ambulatory Visit | Attending: Internal Medicine | Admitting: Internal Medicine

## 2023-04-10 DIAGNOSIS — Z1231 Encounter for screening mammogram for malignant neoplasm of breast: Secondary | ICD-10-CM | POA: Diagnosis present

## 2023-04-13 ENCOUNTER — Other Ambulatory Visit (HOSPITAL_COMMUNITY): Payer: Self-pay | Admitting: Internal Medicine

## 2023-04-13 DIAGNOSIS — R928 Other abnormal and inconclusive findings on diagnostic imaging of breast: Secondary | ICD-10-CM

## 2023-05-11 ENCOUNTER — Ambulatory Visit (HOSPITAL_COMMUNITY)
Admission: RE | Admit: 2023-05-11 | Discharge: 2023-05-11 | Disposition: A | Discharge status: Home / Self Care | Payer: BC Managed Care – PPO | Source: Ambulatory Visit | Attending: Internal Medicine | Admitting: Internal Medicine

## 2023-05-11 ENCOUNTER — Other Ambulatory Visit (HOSPITAL_COMMUNITY): Payer: Self-pay | Admitting: Internal Medicine

## 2023-05-11 ENCOUNTER — Encounter (HOSPITAL_COMMUNITY): Payer: Self-pay

## 2023-05-11 DIAGNOSIS — R928 Other abnormal and inconclusive findings on diagnostic imaging of breast: Secondary | ICD-10-CM

## 2023-05-18 ENCOUNTER — Encounter (HOSPITAL_COMMUNITY): Payer: Self-pay

## 2023-05-18 ENCOUNTER — Ambulatory Visit (HOSPITAL_COMMUNITY)
Admission: RE | Admit: 2023-05-18 | Discharge: 2023-05-18 | Disposition: A | Discharge status: Home / Self Care | Payer: BC Managed Care – PPO | Source: Ambulatory Visit | Attending: Internal Medicine | Admitting: Internal Medicine

## 2023-05-18 ENCOUNTER — Other Ambulatory Visit (HOSPITAL_COMMUNITY): Payer: Self-pay | Admitting: Internal Medicine

## 2023-05-18 DIAGNOSIS — R928 Other abnormal and inconclusive findings on diagnostic imaging of breast: Secondary | ICD-10-CM | POA: Insufficient documentation

## 2023-05-18 HISTORY — PX: BREAST BIOPSY: SHX20

## 2023-05-18 MED ORDER — LIDOCAINE-EPINEPHRINE (PF) 1 %-1:200000 IJ SOLN
10.0000 mL | Freq: Once | INTRAMUSCULAR | Status: AC
  Administered 2023-05-18: 10 mL via INTRADERMAL

## 2023-05-18 MED ORDER — LIDOCAINE HCL (PF) 2 % IJ SOLN
10.0000 mL | Freq: Once | INTRAMUSCULAR | Status: AC
  Administered 2023-05-18: 10 mL

## 2023-05-18 NOTE — Progress Notes (Signed)
 PT tolerated right breast biopsy well today with NAD noted. PT verbalized understanding of discharge instructions. PT ambulated back to the mammogram area this time and given ice packs to use. Specimens taken to lab by [place] from ultrasound for processing.

## 2023-05-19 LAB — SURGICAL PATHOLOGY

## 2023-05-22 ENCOUNTER — Ambulatory Visit (HOSPITAL_COMMUNITY)
Admission: RE | Admit: 2023-05-22 | Discharge: 2023-05-22 | Disposition: A | Discharge status: Home / Self Care | Payer: BC Managed Care – PPO | Source: Ambulatory Visit | Attending: Urology | Admitting: Urology

## 2023-05-22 ENCOUNTER — Ambulatory Visit: Payer: BC Managed Care – PPO | Admitting: Urology

## 2023-05-22 VITALS — BP 154/83 | HR 88

## 2023-05-22 DIAGNOSIS — N281 Cyst of kidney, acquired: Secondary | ICD-10-CM

## 2023-05-22 DIAGNOSIS — N200 Calculus of kidney: Secondary | ICD-10-CM

## 2023-05-22 LAB — URINALYSIS, ROUTINE W REFLEX MICROSCOPIC
Bilirubin, UA: NEGATIVE
Glucose, UA: NEGATIVE
Ketones, UA: NEGATIVE
Nitrite, UA: NEGATIVE
Specific Gravity, UA: 1.02 (ref 1.005–1.030)
Urobilinogen, Ur: 0.2 mg/dL (ref 0.2–1.0)
pH, UA: 6 (ref 5.0–7.5)

## 2023-05-22 LAB — MICROSCOPIC EXAMINATION: Bacteria, UA: NONE SEEN

## 2023-05-22 NOTE — Progress Notes (Unsigned)
 05/22/2023 3:02 PM   Donna Strong 1961/02/07 [id number]  Referring provider: Kirstie Peri, MD [address] [place],  [place] [address]  nephrolithiasis   HPI: Donna Strong is a 62yo here for evaluation of renal cysts and nephrolithiasis. She was last seen 4 years ago. She has a known 15mm right renal cyst. She has intermittent mild dull right flankj pain. No significant LUTS. No other complaints today    PMH: Past Medical History:  Diagnosis Date   H/O thoracic outlet syndrome    Hypothyroidism    Tumor associated pain    pitutary    Surgical History: Past Surgical History:  Procedure Laterality Date   ABDOMINAL HYSTERECTOMY     BONE RESECTION, RIB     BREAST BIOPSY Right 05/18/2023   [place] RT BREAST BX W LOC DEV 1ST LESION IMG BX SPEC US GUIDE 05/18/2023 [facility]   COLONOSCOPY N/A 01/28/2013   Procedure: COLONOSCOPY;  Surgeon: West Bali, MD;  Location: [facility];  Service: Endoscopy;  Laterality: N/A;  9:30 AM-moved to 8:30 Melanie notified pt   COLONOSCOPY N/A 12/07/2018   Procedure: COLONOSCOPY;  Surgeon: West Bali, MD;  Location: [facility];  Service: Endoscopy;  Laterality: N/A;  2:00pm   EAR CYST EXCISION Right 07/02/2012   Procedure: EXCISION OF SEBACEOUS CYST OF SCALP;  Surgeon: Fabio Bering, MD;  Location: [facility];  Service: General;  Laterality: Right;   EAR CYST EXCISION Left 12/2011   POLYPECTOMY  12/07/2018   Procedure: POLYPECTOMY;  Surgeon: West Bali, MD;  Location: [facility];  Service: Endoscopy;;   THYROID LOBECTOMY Right 07/12/2014   with frozen section     DR ROSEN   THYROIDECTOMY Right 07/11/2013   Procedure: RIGHT THYROID LOBECTOMY WITH FROZEN SECTION;  Surgeon: Serena Colonel, MD;  Location: [facility] OR;  Service: ENT;  Laterality: Right;    Home Medications:  Allergies as of 05/22/2023       Reactions   Codeine Nausea And Vomiting   Must have promethazine if she takes.        Medication List        Accurate as of May 22, 2023  3:02 PM. If you have any questions, ask your nurse or doctor.          STOP taking these medications    triamcinolone cream 0.1 % Commonly known as: KENALOG       TAKE these medications    levothyroxine 50 MCG tablet Commonly known as: SYNTHROID Take 50 mcg by mouth daily before breakfast.   multivitamin with minerals tablet Take 1 tablet by mouth daily.        Allergies:  Allergies  Allergen Reactions   Codeine Nausea And Vomiting    Must have promethazine if she takes.    Family History: Family History  Problem Relation Age of Onset   Breast cancer Mother    Breast cancer Maternal Aunt    Colon cancer Maternal Uncle        diagnosed in 50s   Colon polyps Paternal Aunt        per patient, non-cancerous   Colon cancer Other        diagnosed in his 38s    Social History:  reports that she has never smoked. She has never used smokeless tobacco. She reports that she does not drink alcohol and does not use drugs.  ROS: All other review of systems were reviewed and are negative except what is  noted above in HPI  Physical Exam: BP (!) 154/83   Pulse 88   Constitutional:  Alert and oriented, No acute distress. HEENT: [place] AT, moist mucus membranes.  Trachea midline, no masses. Cardiovascular: No clubbing, cyanosis, or edema. Respiratory: Normal respiratory effort, no increased work of breathing. GI: Abdomen is soft, nontender, nondistended, no abdominal masses GU: No CVA tenderness.  Lymph: No cervical or inguinal lymphadenopathy. Skin: No rashes, bruises or suspicious lesions. Neurologic: Grossly intact, no focal deficits, moving all 4 extremities. Psychiatric: Normal mood and affect.  Laboratory Data: Lab Results  Component Value Date   WBC 13.0 (H) 08/24/2018   HGB 12.3 08/24/2018   HCT 37.5 08/24/2018   MCV 89.7 08/24/2018   PLT 345 08/24/2018    Lab Results  Component Value Date   CREATININE 0.83 08/24/2018    No results found  for: "PSA"  No results found for: "TESTOSTERONE"  No results found for: "HGBA1C"  Urinalysis    Component Value Date/Time   COLORURINE YELLOW 08/24/2018 2054   APPEARANCEUR CLEAR 08/24/2018 2054   LABSPEC 1.020 08/24/2018 2054   PHURINE 5.5 08/24/2018 2054   GLUCOSEU NEGATIVE 08/24/2018 2054   HGBUR TRACE (A) 08/24/2018 2054   BILIRUBINUR neg 10/02/2019 1623   KETONESUR NEGATIVE 08/24/2018 2054   PROTEINUR Positive (A) 10/02/2019 1623   PROTEINUR 100 (A) 08/24/2018 2054   UROBILINOGEN 0.2 10/02/2019 1623   NITRITE neg 10/02/2019 1623   NITRITE NEGATIVE 08/24/2018 2054   LEUKOCYTESUR Negative 10/02/2019 1623   LEUKOCYTESUR NEGATIVE 08/24/2018 2054    Lab Results  Component Value Date   BACTERIA FEW (A) 08/24/2018    Pertinent Imaging:  No results found for this or any previous visit.  No results found for this or any previous visit.  No results found for this or any previous visit.  No results found for this or any previous visit.  Results for orders placed during the hospital encounter of 10/02/19  Ultrasound renal complete  Narrative CLINICAL DATA:  63 year female with history of renal cyst presenting with flank pain.  EXAM: RENAL / URINARY TRACT ULTRASOUND COMPLETE  COMPARISON:  Renal ultrasound dated 02/14/2019 and CT abdomen pelvis dated 11/05/2018.  FINDINGS: Right Kidney:  Renal measurements: 11.8 x 6.6 x 6.7 cm = volume: 271 mL. Normal echogenicity. There is a 7 mm nonobstructing upper pole calculus versus a focal area of parenchymal calcification. There is a 1.5 x 1.5 x 1.6 cm upper pole cyst.  Left Kidney:  Renal measurements: 13.0 x 5.7 x 4.9 cm = volume: 190 mL. Normal echogenicity. No hydronephrosis or shadowing stone.  Bladder:  Appears normal for degree of bladder distention.  Other:  There is diffuse increased liver echogenicity most commonly seen in the setting of fatty infiltration. Superimposed inflammation  or fibrosis is not excluded. Clinical correlation is recommended.  IMPRESSION: 1. A 7 mm right renal upper pole nonobstructing calculus versus a focus of parenchymal calcification. No hydronephrosis. 2. Right renal upper pole cyst. 3. Fatty liver.   Electronically Signed By: Elgie Collard M.D. On: 10/02/2019 17:13  No results found for this or any previous visit.  No results found for this or any previous visit.  No results found for this or any previous visit.   Assessment & Plan:    1. Renal cyst (Primary) Followup 6 weeks with a renal US - Urinalysis, Routine w reflex microscopic  2. Nephrolithiasis  - Abdomen 1 view (KUB)   No follow-ups on file.  Wilkie Aye,  MD  [facility] [place]

## 2023-05-25 ENCOUNTER — Encounter: Payer: Self-pay | Admitting: Urology

## 2023-05-25 NOTE — Patient Instructions (Signed)

## 2023-05-30 ENCOUNTER — Other Ambulatory Visit (HOSPITAL_COMMUNITY): Payer: BC Managed Care – PPO

## 2023-05-30 ENCOUNTER — Ambulatory Visit (HOSPITAL_COMMUNITY): Admission: RE | Admit: 2023-05-30 | Payer: BC Managed Care – PPO | Source: Ambulatory Visit

## 2023-05-30 ENCOUNTER — Encounter (HOSPITAL_COMMUNITY): Payer: BC Managed Care – PPO

## 2023-06-19 ENCOUNTER — Ambulatory Visit (HOSPITAL_COMMUNITY): Payer: BC Managed Care – PPO | Attending: Urology

## 2023-06-27 ENCOUNTER — Ambulatory Visit (HOSPITAL_COMMUNITY)
Admission: RE | Admit: 2023-06-27 | Discharge: 2023-06-27 | Disposition: A | Discharge status: Home / Self Care | Source: Ambulatory Visit | Attending: Urology | Admitting: Urology

## 2023-06-27 DIAGNOSIS — N281 Cyst of kidney, acquired: Secondary | ICD-10-CM | POA: Insufficient documentation

## 2023-06-28 ENCOUNTER — Ambulatory Visit: Payer: BC Managed Care – PPO | Admitting: Urology

## 2023-09-06 ENCOUNTER — Ambulatory Visit: Admitting: Urology

## 2023-11-22 ENCOUNTER — Ambulatory Visit: Admitting: Urology
